# Patient Record
Sex: Male | Born: 1970
Health system: Southern US, Community
[De-identification: ages and names within clinical notes are randomized; demographics above are authoritative.]

## PROBLEM LIST (undated history)

## (undated) DIAGNOSIS — G5600 Carpal tunnel syndrome, unspecified upper limb: Secondary | ICD-10-CM

## (undated) DIAGNOSIS — E119 Type 2 diabetes mellitus without complications: Secondary | ICD-10-CM

---

## 2012-02-07 ENCOUNTER — Encounter (HOSPITAL_COMMUNITY): Payer: Self-pay | Admitting: *Deleted

## 2012-02-07 ENCOUNTER — Emergency Department (HOSPITAL_COMMUNITY)
Admission: EM | Admit: 2012-02-07 | Discharge: 2012-02-07 | Disposition: A | Discharge status: Home / Self Care | Payer: Self-pay | Attending: Emergency Medicine | Admitting: Emergency Medicine

## 2012-02-07 ENCOUNTER — Emergency Department (HOSPITAL_COMMUNITY): Payer: Self-pay

## 2012-02-07 DIAGNOSIS — Z862 Personal history of diseases of the blood and blood-forming organs and certain disorders involving the immune mechanism: Secondary | ICD-10-CM | POA: Insufficient documentation

## 2012-02-07 DIAGNOSIS — Z8639 Personal history of other endocrine, nutritional and metabolic disease: Secondary | ICD-10-CM | POA: Insufficient documentation

## 2012-02-07 DIAGNOSIS — R072 Precordial pain: Secondary | ICD-10-CM | POA: Insufficient documentation

## 2012-02-07 DIAGNOSIS — R079 Chest pain, unspecified: Secondary | ICD-10-CM

## 2012-02-07 LAB — CBC WITH DIFFERENTIAL/PLATELET
Basophils Absolute: 0.1 10*3/uL (ref 0.0–0.1)
HCT: 42.4 % (ref 39.0–52.0)
Hemoglobin: 14.7 g/dL (ref 13.0–17.0)
Lymphocytes Relative: 52 % — ABNORMAL HIGH (ref 12–46)
Lymphs Abs: 1.8 10*3/uL (ref 0.7–4.0)
Monocytes Absolute: 0.3 10*3/uL (ref 0.1–1.0)
Monocytes Relative: 8 % (ref 3–12)
Neutro Abs: 1.2 10*3/uL — ABNORMAL LOW (ref 1.7–7.7)
RBC: 4.89 MIL/uL (ref 4.22–5.81)
RDW: 12.9 % (ref 11.5–15.5)
WBC: 3.3 10*3/uL — ABNORMAL LOW (ref 4.0–10.5)

## 2012-02-07 LAB — BASIC METABOLIC PANEL
CO2: 27 mEq/L (ref 19–32)
Chloride: 103 mEq/L (ref 96–112)
Creatinine, Ser: 0.89 mg/dL (ref 0.50–1.35)
Glucose, Bld: 109 mg/dL — ABNORMAL HIGH (ref 70–99)

## 2012-02-07 LAB — TROPONIN I: Troponin I: <0.3 ng/mL (ref <0.30)

## 2012-02-07 NOTE — ED Provider Notes (Signed)
History     CSN: [id number]  Arrival date & time 02/07/12  0901   First MD Initiated Contact with Patient 02/07/12 [phone]      Chief Complaint  Patient presents with  . Chest Pain    (Consider location/radiation/quality/duration/timing/severity/associated sxs/prior treatment) HPI Pt complains of intermittent substernal chest pain x 2 days. Pt states the CP occurs at rest and is "pulling" and pressure-like in quality. 9/10 in severity. He states the only time previous to the last 2 days he has had chest pain was 2 weeks ago while walking. Complains of accompanying SOB. He states that all CP episodes come on suddenly and resolve after a variable amount of time--usually 11m-1h--without intervention.  Denies diaphoresis, nausea. Denies recent illness, fevers/chills. Denies any radiation of the pain. Denies any exacerbating or alleviating factors. States he did not take anything for the pain. Pt has not seen a physician since being released from prison approximately 5 years prior. States he has a history of high cholesterol, and possibly elevated blood sugar. Denies any history of high blood pressure. Pt takes no medications. Pt states his mother had a heart attack at 69 and believes his uncle had a heart attack in his 76s.  History reviewed. No pertinent past medical history.  History reviewed. No pertinent past surgical history.  No family history on file.  History  Substance Use Topics  . Smoking status: Never Smoker   . Smokeless tobacco: Not on file  . Alcohol Use: No      Review of Systems  All other systems reviewed and are negative.    Allergies  Other  Home Medications  No current outpatient prescriptions on file.  BP 117/73  Pulse 59  Temp 98.1 F (36.7 C) (Oral)  Resp 14  Ht 5\' 6"  (1.676 m)  Wt 198 lb (89.812 kg)  BMI 31.96 kg/m2  SpO2 98%  Physical Exam  Constitutional: He is oriented to person, place, and time. He appears well-developed and  well-nourished. No distress.  HENT:  Head: Normocephalic and atraumatic.  Eyes: Pupils are equal, round, and reactive to light. No scleral icterus.  Neck: Normal range of motion. Neck supple. No tracheal deviation present.  Cardiovascular: Normal rate and regular rhythm.   No murmur heard. Pulmonary/Chest: Effort normal. He has no wheezes. He has no rales.       TTP of sternal area (with reproduction of identical pain as CC)  Abdominal: Soft. Bowel sounds are normal. He exhibits no distension. There is no tenderness.  Musculoskeletal: Normal range of motion. He exhibits no edema.  Neurological: He is alert and oriented to person, place, and time. No cranial nerve deficit.  Skin: Skin is dry. No rash noted.  Psychiatric: He has a normal mood and affect. His behavior is normal.    ED Course  Procedures (including critical care time)   Date: 02/07/2012  Rate: 60  Rhythm: normal sinus rhythm  QRS Axis: normal  Intervals: normal  ST/T Wave abnormalities: nonspecific ST changes  Conduction Disutrbances:none  Narrative Interpretation:   Old EKG Reviewed: none available   Labs Reviewed  CBC WITH DIFFERENTIAL - Abnormal; Notable for the following:    WBC 3.3 (*)     Neutrophils Relative 36 (*)     Neutro Abs 1.2 (*)     Lymphocytes Relative 52 (*)     Basophils Relative 2 (*)     All other components within normal limits  BASIC METABOLIC PANEL - Abnormal; Notable for the following:  Glucose, Bld 109 (*)     All other components within normal limits  TROPONIN I   Dg Chest 2 View  02/07/2012  *RADIOLOGY REPORT*  Clinical Data: Mid chest pain, shortness of breath  CHEST - 2 VIEW  Comparison: None  Findings: Upper-normal size of cardiac silhouette. Mediastinal contours and pulmonary vascularity normal. Lungs clear. No pleural effusion or pneumothorax. Bones unremarkable.  IMPRESSION: No acute abnormalities.   Original Report Authenticated By: Ulyses Southward, M.D.      1. Chest pain        MDM  EKG wnl and troponin negative. Pain likely MSK given recent increase in activity (playing basketball) and TTP of chest wall with reproducibility. Patient appropriate for discharge at this time.         Elfredia Nevins, MD 02/07/12 [phone]

## 2012-02-07 NOTE — ED Notes (Signed)
Pt c/o CP describes as a "pulling"  Sensation.  First felt the pain 2 days ago lasted about half the day, pain came back at 3 am today.  Denies any respiratory distress or other difficulties.

## 2012-02-08 NOTE — ED Provider Notes (Signed)
I saw and evaluated the patient, reviewed the resident's note and I agree with the findings and plan.  Chest pain. Low cardiac risk. EKG and enzymes are reassuring. Discharge home.  Date: 02/07/2012  Rate: 60  Rhythm: normal sinus rhythm  QRS Axis: normal  Intervals: normal  ST/T Wave abnormalities: normal  Conduction Disutrbances: none  Narrative Interpretation: unremarkable     Harrold Donath R. Rubin Payor, MD 02/08/12 1046

## 2013-03-23 ENCOUNTER — Encounter (HOSPITAL_COMMUNITY): Payer: Self-pay | Admitting: Emergency Medicine

## 2013-03-23 ENCOUNTER — Emergency Department (HOSPITAL_COMMUNITY)
Admission: EM | Admit: 2013-03-23 | Discharge: 2013-03-23 | Disposition: A | Discharge status: Home / Self Care | Payer: No Typology Code available for payment source | Attending: Emergency Medicine | Admitting: Emergency Medicine

## 2013-03-23 ENCOUNTER — Emergency Department (HOSPITAL_COMMUNITY): Payer: No Typology Code available for payment source

## 2013-03-23 DIAGNOSIS — S39012A Strain of muscle, fascia and tendon of lower back, initial encounter: Secondary | ICD-10-CM

## 2013-03-23 DIAGNOSIS — Y92410 Unspecified street and highway as the place of occurrence of the external cause: Secondary | ICD-10-CM | POA: Insufficient documentation

## 2013-03-23 DIAGNOSIS — S3981XA Other specified injuries of abdomen, initial encounter: Secondary | ICD-10-CM | POA: Insufficient documentation

## 2013-03-23 DIAGNOSIS — S161XXA Strain of muscle, fascia and tendon at neck level, initial encounter: Secondary | ICD-10-CM

## 2013-03-23 DIAGNOSIS — Y9389 Activity, other specified: Secondary | ICD-10-CM | POA: Insufficient documentation

## 2013-03-23 DIAGNOSIS — S335XXA Sprain of ligaments of lumbar spine, initial encounter: Secondary | ICD-10-CM | POA: Insufficient documentation

## 2013-03-23 DIAGNOSIS — S139XXA Sprain of joints and ligaments of unspecified parts of neck, initial encounter: Secondary | ICD-10-CM | POA: Insufficient documentation

## 2013-03-23 MED ORDER — CYCLOBENZAPRINE HCL 10 MG PO TABS: 10.0000 mg | ORAL_TABLET | Freq: Two times a day (BID) | ORAL | Status: DC | PRN

## 2013-03-23 MED ORDER — HYDROCODONE-ACETAMINOPHEN 5-325 MG PO TABS: 1.0000 | ORAL_TABLET | ORAL | Status: DC | PRN

## 2013-03-23 MED ORDER — HYDROCODONE-ACETAMINOPHEN 5-325 MG PO TABS
1.0000 | ORAL_TABLET | Freq: Once | ORAL | Status: AC
  Administered 2013-03-23: 1 via ORAL
  Filled 2013-03-23: qty 1

## 2013-03-23 NOTE — Progress Notes (Signed)
[facility] did not get to see patient but will be sending information on the [employer] application, using the address provided.  °
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
## 2013-03-23 NOTE — ED Provider Notes (Signed)
Medical screening examination/treatment/procedure(s) were performed by non-physician practitioner and as supervising physician I was immediately available for consultation/collaboration.  EKG Interpretation   None         William Anntionette Madkins, MD 03/23/13 1445 
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
## 2017-07-11 ENCOUNTER — Encounter (HOSPITAL_BASED_OUTPATIENT_CLINIC_OR_DEPARTMENT_OTHER): Payer: Self-pay | Admitting: *Deleted

## 2017-07-11 ENCOUNTER — Emergency Department (HOSPITAL_BASED_OUTPATIENT_CLINIC_OR_DEPARTMENT_OTHER)
Admission: EM | Admit: 2017-07-11 | Discharge: 2017-07-11 | Disposition: A | Discharge status: Home / Self Care | Payer: Self-pay | Attending: Emergency Medicine | Admitting: Emergency Medicine

## 2017-07-11 ENCOUNTER — Other Ambulatory Visit: Payer: Self-pay

## 2017-07-11 DIAGNOSIS — G5603 Carpal tunnel syndrome, bilateral upper limbs: Secondary | ICD-10-CM | POA: Insufficient documentation

## 2017-07-11 MED ORDER — HYDROCODONE-ACETAMINOPHEN 5-325 MG PO TABS: 1.0000 | ORAL_TABLET | Freq: Four times a day (QID) | ORAL | 0 refills | Status: DC | PRN

## 2017-07-11 MED ORDER — PREDNISONE 50 MG PO TABS: 50.0000 mg | ORAL_TABLET | Freq: Every day | ORAL | 0 refills | Status: DC

## 2017-07-11 MED ORDER — KETOROLAC TROMETHAMINE 60 MG/2ML IM SOLN
60.0000 mg | Freq: Once | INTRAMUSCULAR | Status: AC
  Administered 2017-07-11: 60 mg via INTRAMUSCULAR
  Filled 2017-07-11: qty 2

## 2017-07-11 NOTE — ED Triage Notes (Signed)
Numbness in both his hands x 2 weeks. States he has a hx of carpel tunnel.

## 2017-07-11 NOTE — Discharge Instructions (Addendum)
Return here as needed. Follow up with the hand surgeon provided.  °

## 2017-07-11 NOTE — ED Provider Notes (Signed)
[facility] EMERGENCY DEPARTMENT Provider Note   CSN: [id number] Arrival date & time: 07/11/17  1606     History   Chief Complaint No chief complaint on file.   HPI Tyrone Jennings is a 47 y.o. male.  HPI Patient presents to the emergency department with pain and tingling in his hands bilaterally.  The patient states he has had a history of this in the past and had injections into both wrist.  He states that that was about 5 years ago.  The patient states that he has not had many problems since then.  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  Patient states nothing seems to make the condition better or worse patient states he has had no fever, nausea, vomiting, weakness, dizziness or numbness.  States the pain seems to be worse at night. History reviewed. No pertinent past medical history.  There are no active problems to display for this patient.   History reviewed. No pertinent surgical history.      Home Medications    Prior to Admission medications   Medication Sig Start Date End Date Taking? Authorizing Provider  cyclobenzaprine (FLEXERIL) 10 MG tablet Take 1 tablet (10 mg total) by mouth 2 (two) times daily as needed for muscle spasms. 03/23/13   Cherrie Distance, PA-C  HYDROcodone-acetaminophen (NORCO/VICODIN) 5-325 MG per tablet Take 1 tablet by mouth every 4 (four) hours as needed for moderate pain. 03/23/13   Cherrie Distance, PA-C    Family History Family History  Problem Relation Age of Onset  . Diabetes Other     Social History Social History   Tobacco Use  . Smoking status: Never Smoker  . Smokeless tobacco: Never Used  Substance Use Topics  . Alcohol use: No  . Drug use: No     Allergies   Other   Review of Systems Review of Systems All other systems negative except as documented in the HPI. All pertinent positives and negatives as reviewed in the HPI.  Physical Exam Updated Vital Signs BP 133/77 (BP Location:  Right Arm)   Pulse 92   Temp 98.7 F (37.1 C) (Oral)   Resp 16   Ht 5\' 6"  (1.676 m)   Wt 93 kg (205 lb)   SpO2 98%   BMI 33.09 kg/m   Physical Exam  Constitutional: He is oriented to person, place, and time. He appears well-developed and well-nourished. No distress.  HENT:  Head: Normocephalic and atraumatic.  Eyes: Pupils are equal, round, and reactive to light.  Pulmonary/Chest: Effort normal.  Musculoskeletal:       Right wrist: He exhibits tenderness. He exhibits no bony tenderness, no swelling, no effusion, no crepitus and no deformity.       Left wrist: He exhibits tenderness. He exhibits no swelling, no effusion, no crepitus and no deformity.  Neurological: He is alert and oriented to person, place, and time.  Skin: Skin is warm and dry.  Psychiatric: He has a normal mood and affect.  Nursing note and vitals reviewed.    ED Treatments / Results  Labs (all labs ordered are listed, but only abnormal results are displayed) Labs Reviewed - No data to display  EKG None  Radiology No results found.  Procedures Procedures (including critical care time)  Medications Ordered in ED Medications  ketorolac (TORADOL) injection 60 mg (has no administration in time range)     Initial Impression / Assessment and Plan / ED Course  I have  reviewed the triage vital signs and the nursing notes.  Pertinent labs & imaging results that were available during my care of the patient were reviewed by me and considered in my medical decision making (see chart for details).    She will be referred to hand surgery told return here as needed advised the patient to use heat over the wrists as well as the splints at bedtime.  Patient agrees the plan and all questions were answered.  Final Clinical Impressions(s) / ED Diagnoses   Final diagnoses:  None    ED Discharge Orders    None       Kyra MangesLawyer, Tali Cleaves, PA-C 07/11/17 1749    Rolland PorterJames, Mark, MD 07/12/17 2022

## 2020-03-09 ENCOUNTER — Inpatient Hospital Stay (HOSPITAL_BASED_OUTPATIENT_CLINIC_OR_DEPARTMENT_OTHER)
Admission: EM | Admit: 2020-03-09 | Discharge: 2020-03-11 | DRG: 638 | Disposition: A | Discharge status: Home / Self Care | Payer: Self-pay | Attending: Internal Medicine | Admitting: Internal Medicine

## 2020-03-09 ENCOUNTER — Other Ambulatory Visit: Payer: Self-pay

## 2020-03-09 ENCOUNTER — Encounter (HOSPITAL_BASED_OUTPATIENT_CLINIC_OR_DEPARTMENT_OTHER): Payer: Self-pay

## 2020-03-09 DIAGNOSIS — Z833 Family history of diabetes mellitus: Secondary | ICD-10-CM

## 2020-03-09 DIAGNOSIS — E669 Obesity, unspecified: Secondary | ICD-10-CM | POA: Diagnosis present

## 2020-03-09 DIAGNOSIS — N179 Acute kidney failure, unspecified: Secondary | ICD-10-CM | POA: Diagnosis present

## 2020-03-09 DIAGNOSIS — Z6834 Body mass index (BMI) 34.0-34.9, adult: Secondary | ICD-10-CM

## 2020-03-09 DIAGNOSIS — E1100 Type 2 diabetes mellitus with hyperosmolarity without nonketotic hyperglycemic-hyperosmolar coma (NKHHC): Secondary | ICD-10-CM | POA: Diagnosis present

## 2020-03-09 DIAGNOSIS — E1165 Type 2 diabetes mellitus with hyperglycemia: Principal | ICD-10-CM | POA: Diagnosis present

## 2020-03-09 DIAGNOSIS — R7989 Other specified abnormal findings of blood chemistry: Secondary | ICD-10-CM

## 2020-03-09 DIAGNOSIS — E876 Hypokalemia: Secondary | ICD-10-CM | POA: Diagnosis not present

## 2020-03-09 DIAGNOSIS — R739 Hyperglycemia, unspecified: Secondary | ICD-10-CM

## 2020-03-09 DIAGNOSIS — Z20822 Contact with and (suspected) exposure to covid-19: Secondary | ICD-10-CM | POA: Diagnosis present

## 2020-03-09 HISTORY — DX: Carpal tunnel syndrome, unspecified upper limb: G56.00

## 2020-03-09 LAB — BASIC METABOLIC PANEL
Anion gap: 17 — ABNORMAL HIGH (ref 5–15)
Anion gap: 14 (ref 5–15)
Anion gap: 10 (ref 5–15)
BUN: 18 mg/dL (ref 6–20)
BUN: 13 mg/dL (ref 6–20)
BUN: 11 mg/dL (ref 6–20)
CO2: 24 mmol/L (ref 22–32)
CO2: 27 mmol/L (ref 22–32)
CO2: 29 mmol/L (ref 22–32)
Calcium: 10.8 mg/dL — ABNORMAL HIGH (ref 8.9–10.3)
Calcium: 10.6 mg/dL — ABNORMAL HIGH (ref 8.9–10.3)
Calcium: 10.5 mg/dL — ABNORMAL HIGH (ref 8.9–10.3)
Chloride: 82 mmol/L — ABNORMAL LOW (ref 98–111)
Chloride: 100 mmol/L (ref 98–111)
Chloride: 104 mmol/L (ref 98–111)
Creatinine, Ser: 1.62 mg/dL — ABNORMAL HIGH (ref 0.61–1.24)
Creatinine, Ser: 1.08 mg/dL (ref 0.61–1.24)
Creatinine, Ser: 1.12 mg/dL (ref 0.61–1.24)
GFR, Estimated: 51 mL/min — ABNORMAL LOW (ref >60)
GFR, Estimated: >60 mL/min (ref >60)
GFR, Estimated: >60 mL/min (ref >60)
Glucose, Bld: 1194 mg/dL (ref 70–99)
Glucose, Bld: 286 mg/dL — ABNORMAL HIGH (ref 70–99)
Glucose, Bld: 222 mg/dL — ABNORMAL HIGH (ref 70–99)
Potassium: 4.9 mmol/L (ref 3.5–5.1)
Potassium: 3.8 mmol/L (ref 3.5–5.1)
Potassium: 3.7 mmol/L (ref 3.5–5.1)
Sodium: 123 mmol/L — ABNORMAL LOW (ref 135–145)
Sodium: 141 mmol/L (ref 135–145)
Sodium: 143 mmol/L (ref 135–145)

## 2020-03-09 LAB — I-STAT VENOUS BLOOD GAS, ED
Acid-Base Excess: 3 mmol/L — ABNORMAL HIGH (ref 0.0–2.0)
Bicarbonate: 29.8 mmol/L — ABNORMAL HIGH (ref 20.0–28.0)
Calcium, Ion: 1.29 mmol/L (ref 1.15–1.40)
HCT: 49 % (ref 39.0–52.0)
Hemoglobin: 16.7 g/dL (ref 13.0–17.0)
O2 Saturation: 92 %
Potassium: 5 mmol/L (ref 3.5–5.1)
Sodium: 125 mmol/L — ABNORMAL LOW (ref 135–145)
TCO2: 31 mmol/L (ref 22–32)
pCO2, Ven: 50.5 mmHg (ref 44.0–60.0)
pH, Ven: 7.378 (ref 7.250–7.430)
pO2, Ven: 66 mmHg — ABNORMAL HIGH (ref 32.0–45.0)

## 2020-03-09 LAB — HEPATIC FUNCTION PANEL
ALT: 33 U/L (ref 0–44)
AST: 23 U/L (ref 15–41)
Albumin: 5 g/dL (ref 3.5–5.0)
Alkaline Phosphatase: 133 U/L — ABNORMAL HIGH (ref 38–126)
Bilirubin, Direct: 0.3 mg/dL — ABNORMAL HIGH (ref 0.0–0.2)
Indirect Bilirubin: 1 mg/dL — ABNORMAL HIGH (ref 0.3–0.9)
Total Bilirubin: 1.3 mg/dL — ABNORMAL HIGH (ref 0.3–1.2)
Total Protein: 9.7 g/dL — ABNORMAL HIGH (ref 6.5–8.1)

## 2020-03-09 LAB — URINALYSIS, MICROSCOPIC (REFLEX)

## 2020-03-09 LAB — CBC WITH DIFFERENTIAL/PLATELET
Abs Immature Granulocytes: 0.01 10*3/uL (ref 0.00–0.07)
Basophils Absolute: 0 10*3/uL (ref 0.0–0.1)
Basophils Relative: 1 %
Eosinophils Absolute: 0 10*3/uL (ref 0.0–0.5)
Eosinophils Relative: 1 %
HCT: 45.5 % (ref 39.0–52.0)
Hemoglobin: 15.8 g/dL (ref 13.0–17.0)
Immature Granulocytes: 0 %
Lymphocytes Relative: 28 %
Lymphs Abs: 1.6 10*3/uL (ref 0.7–4.0)
MCH: 30 pg (ref 26.0–34.0)
MCHC: 34.7 g/dL (ref 30.0–36.0)
MCV: 86.3 fL (ref 80.0–100.0)
Monocytes Absolute: 0.4 10*3/uL (ref 0.1–1.0)
Monocytes Relative: 7 %
Neutro Abs: 3.6 10*3/uL (ref 1.7–7.7)
Neutrophils Relative %: 63 %
Platelets: 275 10*3/uL (ref 150–400)
RBC: 5.27 MIL/uL (ref 4.22–5.81)
RDW: 11.9 % (ref 11.5–15.5)
WBC: 5.7 10*3/uL (ref 4.0–10.5)
nRBC: 0 % (ref 0.0–0.2)

## 2020-03-09 LAB — RESP PANEL BY RT-PCR (FLU A&B, COVID) ARPGX2
Influenza A by PCR: NEGATIVE
Influenza B by PCR: NEGATIVE
SARS Coronavirus 2 by RT PCR: NEGATIVE

## 2020-03-09 LAB — CBG MONITORING, ED
Glucose-Capillary: >600 mg/dL (ref 70–99)
Glucose-Capillary: >600 mg/dL (ref 70–99)
Glucose-Capillary: >600 mg/dL (ref 70–99)
Glucose-Capillary: >600 mg/dL (ref 70–99)
Glucose-Capillary: >600 mg/dL (ref 70–99)
Glucose-Capillary: 484 mg/dL — ABNORMAL HIGH (ref 70–99)
Glucose-Capillary: 376 mg/dL — ABNORMAL HIGH (ref 70–99)
Glucose-Capillary: 297 mg/dL — ABNORMAL HIGH (ref 70–99)
Glucose-Capillary: 266 mg/dL — ABNORMAL HIGH (ref 70–99)

## 2020-03-09 LAB — URINALYSIS, ROUTINE W REFLEX MICROSCOPIC
Bilirubin Urine: NEGATIVE
Glucose, UA: >=500 mg/dL — AB
Ketones, ur: NEGATIVE mg/dL
Leukocytes,Ua: NEGATIVE
Nitrite: NEGATIVE
Protein, ur: NEGATIVE mg/dL
Specific Gravity, Urine: <1.005 — ABNORMAL LOW (ref 1.005–1.030)
pH: 5 (ref 5.0–8.0)

## 2020-03-09 LAB — HEMOGLOBIN A1C
Hgb A1c MFr Bld: 10.5 % — ABNORMAL HIGH (ref 4.8–5.6)
Mean Plasma Glucose: 254.65 mg/dL

## 2020-03-09 LAB — GLUCOSE, CAPILLARY
Glucose-Capillary: 145 mg/dL — ABNORMAL HIGH (ref 70–99)
Glucose-Capillary: 163 mg/dL — ABNORMAL HIGH (ref 70–99)
Glucose-Capillary: 165 mg/dL — ABNORMAL HIGH (ref 70–99)
Glucose-Capillary: 170 mg/dL — ABNORMAL HIGH (ref 70–99)
Glucose-Capillary: 192 mg/dL — ABNORMAL HIGH (ref 70–99)
Glucose-Capillary: 207 mg/dL — ABNORMAL HIGH (ref 70–99)
Glucose-Capillary: 211 mg/dL — ABNORMAL HIGH (ref 70–99)
Glucose-Capillary: 226 mg/dL — ABNORMAL HIGH (ref 70–99)

## 2020-03-09 LAB — MRSA PCR SCREENING: MRSA by PCR: NEGATIVE

## 2020-03-09 LAB — LIPASE, BLOOD: Lipase: 32 U/L (ref 11–51)

## 2020-03-09 LAB — BETA-HYDROXYBUTYRIC ACID: Beta-Hydroxybutyric Acid: 0.57 mmol/L — ABNORMAL HIGH (ref 0.05–0.27)

## 2020-03-09 MED ORDER — SODIUM CHLORIDE 0.9 % IV BOLUS
1000.0000 mL | INTRAVENOUS | Status: AC
  Administered 2020-03-09 (×2): 1000 mL via INTRAVENOUS

## 2020-03-09 MED ORDER — DEXTROSE 50 % IV SOLN: 0.0000 mL | INTRAVENOUS | Status: DC | PRN

## 2020-03-09 MED ORDER — ENOXAPARIN SODIUM 40 MG/0.4ML ~~LOC~~ SOLN
40.0000 mg | SUBCUTANEOUS | Status: DC
  Administered 2020-03-09 – 2020-03-10 (×2): 40 mg via SUBCUTANEOUS
  Filled 2020-03-09 (×2): qty 0.4

## 2020-03-09 MED ORDER — INSULIN REGULAR(HUMAN) IN NACL 100-0.9 UT/100ML-% IV SOLN
INTRAVENOUS | Status: AC
  Administered 2020-03-09: 10:00:00 6 [IU]/h via INTRAVENOUS
  Filled 2020-03-09: qty 100

## 2020-03-09 MED ORDER — PNEUMOCOCCAL VAC POLYVALENT 25 MCG/0.5ML IJ INJ
0.5000 mL | INJECTION | INTRAMUSCULAR | Status: DC
  Filled 2020-03-09: qty 0.5

## 2020-03-09 MED ORDER — INSULIN REGULAR(HUMAN) IN NACL 100-0.9 UT/100ML-% IV SOLN
INTRAVENOUS | Status: DC
  Administered 2020-03-09: 18:00:00 1.3 [IU]/h via INTRAVENOUS

## 2020-03-09 MED ORDER — DEXTROSE IN LACTATED RINGERS 5 % IV SOLN: INTRAVENOUS | Status: DC

## 2020-03-09 MED ORDER — CHLORHEXIDINE GLUCONATE CLOTH 2 % EX PADS
6.0000 | MEDICATED_PAD | Freq: Every day | CUTANEOUS | Status: DC
  Administered 2020-03-09 – 2020-03-10 (×2): 6 via TOPICAL

## 2020-03-09 MED ORDER — SODIUM CHLORIDE 0.9 % IV BOLUS: 1000.0000 mL | Freq: Once | INTRAVENOUS | Status: DC

## 2020-03-09 MED ORDER — LACTATED RINGERS IV SOLN: INTRAVENOUS | Status: DC

## 2020-03-09 NOTE — ED Triage Notes (Signed)
He c/o polyuria and polydipsia x 2 days. He denies fever/dysuria,nor any other sign of current illness. He is ambulatory and in no distress.

## 2020-03-09 NOTE — ED Notes (Signed)
Report to [facility] 

## 2020-03-09 NOTE — ED Notes (Signed)
As I write this Dr. Lockie Mola is speaking with pt. And his significant other per their request.

## 2020-03-09 NOTE — ED Notes (Signed)
Thus far, the series of "HI" readings notwithstanding, the Endotool has instructed me not to change the insulin infusion from 6 units per hour, at which it was initially started.

## 2020-03-09 NOTE — ED Notes (Signed)
Insulin drip remains the same at 4.2
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
## 2020-03-09 NOTE — H&P (Signed)
[facility] History and Physical  [name] IEP:[id number] DOB: [birth year] DOA: 03/09/2020  Referring physician: ED  PCP: Patient, No Pcp Per   Patient is coming from: Home  Chief Complaint: Polyuria, polydipsia  HPI: Tyrone Jennings is a 49 y.o. male with no documented past medical history except for history of carpal tunnel syndrome in the past currently not on any medication presented to the hospital with complaints of frequent urination and excessive thirst for the last 2 days.  He also complained of vomiting yesterday without much abdominal pain.  Patient denied any urinary urgency, burning but had polyuria.  Denied any fever, chills, shortness of breath, cough or chest pain.  Denied any dizziness, lightheadedness headache or syncope.  Patient denied any skin rash, swelling of joints.  Patient denies any changes in his dietary regimen except that he drinks a lot of Gatorade.  Denies history of diabetes or any other medical issues in the past.    Due to the symptoms, patient presented to [facility] med center where he was noted to have elevated blood glucose level of 1194.  Urinalysis showed more than 500 glucose. Patient was diagnosed of having hyperosmolar state and was started on insulin and IV fluids.  Patient was then considered for admission admission to stepdown unit for insulin drip.  Review of Systems:  All systems were reviewed and were negative unless otherwise mentioned in the HPI  Past Medical History:  Diagnosis Date  . Carpal tunnel syndrome    History reviewed. No pertinent surgical history.  Social History:  reports that he has never smoked. He has never used smokeless tobacco. He reports that he does not drink alcohol and does not use drugs.  Allergies  Allergen Reactions  . Other Other (See Comments)    Clorox. "flares me up."    Family History  Problem Relation Age of Onset  . Diabetes  mother      Prior to Admission medications   Medication Sig  Start Date End Date Taking? Authorizing Provider  cyclobenzaprine (FLEXERIL) 10 MG tablet Take 1 tablet (10 mg total) by mouth 2 (two) times daily as needed for muscle spasms. 03/23/13   Cherrie Distance, PA-C  HYDROcodone-acetaminophen (NORCO/VICODIN) 5-325 MG tablet Take 1 tablet by mouth every 6 (six) hours as needed for moderate pain. 07/11/17   Lawyer, Cristal Deer, PA-C  predniSONE (DELTASONE) 50 MG tablet Take 1 tablet (50 mg total) by mouth daily with breakfast. 07/11/17   Charlestine Night, PA-C    Physical Exam: Vitals:   03/09/20 1430 03/09/20 1500 03/09/20 1600 03/09/20 1700  BP:  133/83 132/83   Pulse: (!) 102 92 89   Resp: 20 18 18    Temp:   98.5 F (36.9 C) 98.1 F (36.7 C)  TempSrc:   Oral Oral  SpO2: 97% 97% 97%   Weight:      Height:       Wt Readings from Last 3 Encounters:  03/09/20 97.5 kg  07/11/17 93 kg  02/07/12 89.8 kg   Body mass index is 34.7 kg/m.  General: Obese built, not in obvious distress HENT: Normocephalic, pupils equally reacting to light and accommodation.  No scleral pallor or icterus noted. Oral mucosa is dry Chest:  Clear breath sounds.  Diminished breath sounds bilaterally. No crackles or wheezes.  CVS: S1 &S2 heard. No murmur.  Regular rate and rhythm. Abdomen: Soft, nontender, nondistended.  Bowel sounds are heard.  Liver is not palpable, no abdominal mass palpated Extremities: No cyanosis, clubbing  or edema.  Peripheral pulses are palpable. Psych: Alert, awake and oriented, normal mood CNS:  No cranial nerve deficits.  Power equal in all extremities.   No cerebellar signs.   Skin: Warm and dry.  No rashes noted.  Labs on Admission:   CBC: Recent Labs  Lab 03/09/20 0800 03/09/20 0822  WBC 5.7  --   NEUTROABS 3.6  --   HGB 15.8 16.7  HCT 45.5 49.0  MCV 86.3  --   PLT 275  --     Basic Metabolic Panel: Recent Labs  Lab 03/09/20 0800 03/09/20 0822 03/09/20 1532  NA 123* 125* 141  K 4.9 5.0 3.8  CL 82*  --  100  CO2  24  --  27  GLUCOSE 1,194*  --  286*  BUN 18  --  13  CREATININE 1.62*  --  1.08  CALCIUM 10.8*  --  10.6*    Liver Function Tests: Recent Labs  Lab 03/09/20 0800  AST 23  ALT 33  ALKPHOS 133*  BILITOT 1.3*  PROT 9.7*  ALBUMIN 5.0   Recent Labs  Lab 03/09/20 0800  LIPASE 32   No results for input(s): AMMONIA in the last 168 hours.  Cardiac Enzymes: No results for input(s): CKTOTAL, CKMB, CKMBINDEX, TROPONINI in the last 168 hours.  BNP (last 3 results) No results for input(s): BNP in the last 8760 hours.  ProBNP (last 3 results) No results for input(s): PROBNP in the last 8760 hours.  CBG: Recent Labs  Lab 03/09/20 1235 03/09/20 1341 03/09/20 1442 03/09/20 1541 03/09/20 1654  GLUCAP 484* 376* 297* 266* 207*    Lipase     Component Value Date/Time   LIPASE 32 03/09/2020 0800     Urinalysis    Component Value Date/Time   COLORURINE YELLOW 03/09/2020 0734   APPEARANCEUR CLEAR 03/09/2020 0734   LABSPEC <1.005 (L) 03/09/2020 0734   PHURINE 5.0 03/09/2020 0734   GLUCOSEU >=500 (A) 03/09/2020 0734   HGBUR TRACE (A) 03/09/2020 0734   BILIRUBINUR NEGATIVE 03/09/2020 0734   KETONESUR NEGATIVE 03/09/2020 0734   PROTEINUR NEGATIVE 03/09/2020 0734   NITRITE NEGATIVE 03/09/2020 0734   LEUKOCYTESUR NEGATIVE 03/09/2020 0734     Drugs of Abuse  No results found for: LABOPIA, COCAINSCRNUR, LABBENZ, AMPHETMU, THCU, LABBARB    Radiological Exams on Admission: No results found.  EKG: Not available for review  Assessment/Plan Principal Problem:   Hyperosmolar hyperglycemic state (HHS) (HCC)  Hyperosmolar hyperglycemic state.  Blood glucose level more than 1100 on presentation.  Likely new diagnosis of diabetes.  Patient is obese.  Family history of diabetes.  Will check A1c.   Continue insulin drip and IV fluids.  Change to long-acting insulin sliding scale insulin with the next BMP.Mild gap but bicarb was normal.  We will continue insulin given the fact that  patient had extremely elevated blood glucose levels.  Pseudohyponatremia.  Improved with hydration and glucose control.  Mild acute kidney injury.  Improved with IV fluids.  Check levels in a.m.  History of carpal tunnel syndrome in the past.  Not active at this time.  DVT Prophylaxis: Lovenox subcu  Consultant: None  Code Status: Full code  Microbiology none  Antibiotics: None  Family Communication:  Patients' condition and plan of care including tests being ordered have been discussed with the patient who indicate understanding and agree with the plan.   Status is: Inpatient  Remains inpatient appropriate because:Persistent severe electrolyte disturbances, IV treatments appropriate due to intensity  of illness or inability to take PO, Inpatient level of care appropriate due to severity of illness and IV insulin, likely new diabetes.   Dispo: The patient is from: Home              Anticipated d/c is to: Home              Anticipated d/c date is: 1 day              Patient currently is not medically stable to d/c.   Severity of Illness: The appropriate patient status for this patient is INPATIENT. Inpatient status is judged to be reasonable and necessary in order to provide the required intensity of service to ensure the patient's safety. The patient's presenting symptoms, physical exam findings, and initial radiographic and laboratory data in the context of their chronic comorbidities is felt to place them at high risk for further clinical deterioration. Furthermore, it is not anticipated that the patient will be medically stable for discharge from the hospital within 2 midnights of admission.  I certify that at the point of admission it is my clinical judgment that the patient will require inpatient hospital care spanning beyond 2 midnights from the point of admission due to high intensity of service, high risk for further deterioration and high frequency of surveillance  required.   Signed, Joycelyn Das, MD [facility] 03/09/2020

## 2020-03-10 DIAGNOSIS — E876 Hypokalemia: Secondary | ICD-10-CM

## 2020-03-10 DIAGNOSIS — E119 Type 2 diabetes mellitus without complications: Secondary | ICD-10-CM

## 2020-03-10 LAB — BASIC METABOLIC PANEL
Anion gap: 9 (ref 5–15)
BUN: 10 mg/dL (ref 6–20)
CO2: 27 mmol/L (ref 22–32)
Calcium: 9.8 mg/dL (ref 8.9–10.3)
Chloride: 105 mmol/L (ref 98–111)
Creatinine, Ser: 1.1 mg/dL (ref 0.61–1.24)
GFR, Estimated: >60 mL/min (ref >60)
Glucose, Bld: 170 mg/dL — ABNORMAL HIGH (ref 70–99)
Potassium: 3.4 mmol/L — ABNORMAL LOW (ref 3.5–5.1)
Sodium: 141 mmol/L (ref 135–145)

## 2020-03-10 LAB — CBC
HCT: 42.7 % (ref 39.0–52.0)
HCT: 43.2 % (ref 39.0–52.0)
Hemoglobin: 14.8 g/dL (ref 13.0–17.0)
Hemoglobin: 15 g/dL (ref 13.0–17.0)
MCH: 29.5 pg (ref 26.0–34.0)
MCH: 29.8 pg (ref 26.0–34.0)
MCHC: 34.7 g/dL (ref 30.0–36.0)
MCHC: 34.7 g/dL (ref 30.0–36.0)
MCV: 84.9 fL (ref 80.0–100.0)
MCV: 85.9 fL (ref 80.0–100.0)
Platelets: 237 10*3/uL (ref 150–400)
Platelets: 250 10*3/uL (ref 150–400)
RBC: 4.97 MIL/uL (ref 4.22–5.81)
RBC: 5.09 MIL/uL (ref 4.22–5.81)
RDW: 11.9 % (ref 11.5–15.5)
RDW: 11.9 % (ref 11.5–15.5)
WBC: 6.9 10*3/uL (ref 4.0–10.5)
WBC: 7.4 10*3/uL (ref 4.0–10.5)
nRBC: 0 % (ref 0.0–0.2)
nRBC: 0 % (ref 0.0–0.2)

## 2020-03-10 LAB — GLUCOSE, CAPILLARY
Glucose-Capillary: 159 mg/dL — ABNORMAL HIGH (ref 70–99)
Glucose-Capillary: 163 mg/dL — ABNORMAL HIGH (ref 70–99)
Glucose-Capillary: 149 mg/dL — ABNORMAL HIGH (ref 70–99)
Glucose-Capillary: 151 mg/dL — ABNORMAL HIGH (ref 70–99)
Glucose-Capillary: 154 mg/dL — ABNORMAL HIGH (ref 70–99)
Glucose-Capillary: 155 mg/dL — ABNORMAL HIGH (ref 70–99)
Glucose-Capillary: 141 mg/dL — ABNORMAL HIGH (ref 70–99)
Glucose-Capillary: 351 mg/dL — ABNORMAL HIGH (ref 70–99)
Glucose-Capillary: 289 mg/dL — ABNORMAL HIGH (ref 70–99)
Glucose-Capillary: 191 mg/dL — ABNORMAL HIGH (ref 70–99)

## 2020-03-10 LAB — MAGNESIUM: Magnesium: 2.2 mg/dL (ref 1.7–2.4)

## 2020-03-10 LAB — PHOSPHORUS: Phosphorus: 2.9 mg/dL (ref 2.5–4.6)

## 2020-03-10 MED ORDER — INSULIN ASPART PROT & ASPART (70-30 MIX) 100 UNIT/ML ~~LOC~~ SUSP
12.0000 [IU] | Freq: Two times a day (BID) | SUBCUTANEOUS | Status: DC
  Administered 2020-03-10 – 2020-03-11 (×2): 12 [IU] via SUBCUTANEOUS
  Filled 2020-03-10: qty 10

## 2020-03-10 MED ORDER — INSULIN ASPART 100 UNIT/ML ~~LOC~~ SOLN
0.0000 [IU] | Freq: Three times a day (TID) | SUBCUTANEOUS | Status: DC
  Administered 2020-03-10: 08:00:00 2 [IU] via SUBCUTANEOUS
  Administered 2020-03-10: 12:00:00 9 [IU] via SUBCUTANEOUS

## 2020-03-10 MED ORDER — DEXTROSE IN LACTATED RINGERS 5 % IV SOLN: INTRAVENOUS | Status: DC

## 2020-03-10 MED ORDER — INSULIN STARTER KIT- PEN NEEDLES (ENGLISH)
1.0000 | Freq: Once | Status: AC
  Administered 2020-03-10: 12:00:00 1
  Filled 2020-03-10: qty 1

## 2020-03-10 MED ORDER — LIVING WELL WITH DIABETES BOOK
Freq: Once | Status: AC
  Filled 2020-03-10: qty 1

## 2020-03-10 MED ORDER — INSULIN ASPART 100 UNIT/ML ~~LOC~~ SOLN: 0.0000 [IU] | Freq: Every day | SUBCUTANEOUS | Status: DC

## 2020-03-10 MED ORDER — ACETAMINOPHEN 325 MG PO TABS
650.0000 mg | ORAL_TABLET | Freq: Four times a day (QID) | ORAL | Status: DC | PRN
  Administered 2020-03-10: 650 mg via ORAL
  Filled 2020-03-10: qty 2

## 2020-03-10 MED ORDER — COVID-19 MRNA VACCINE (PFIZER) 30 MCG/0.3ML IM SUSP
0.3000 mL | Freq: Once | INTRAMUSCULAR | Status: DC
  Filled 2020-03-10: qty 0.3

## 2020-03-10 MED ORDER — INSULIN ASPART 100 UNIT/ML ~~LOC~~ SOLN
0.0000 [IU] | Freq: Three times a day (TID) | SUBCUTANEOUS | Status: DC
  Administered 2020-03-10 – 2020-03-11 (×2): 11 [IU] via SUBCUTANEOUS
  Administered 2020-03-11: 12:00:00 4 [IU] via SUBCUTANEOUS

## 2020-03-10 MED ORDER — INSULIN ASPART 100 UNIT/ML ~~LOC~~ SOLN: 4.0000 [IU] | Freq: Three times a day (TID) | SUBCUTANEOUS | Status: DC

## 2020-03-10 MED ORDER — INSULIN GLARGINE 100 UNIT/ML ~~LOC~~ SOLN
10.0000 [IU] | Freq: Every day | SUBCUTANEOUS | Status: DC
  Administered 2020-03-10: 08:00:00 10 [IU] via SUBCUTANEOUS
  Filled 2020-03-10: qty 0.1

## 2020-03-10 NOTE — TOC Initial Note (Signed)
Transition of Care [facility]) - Initial/Assessment Note    Patient Details  Name: Tyrone Jennings MRN: [id number] Date of Birth: 23-Nov-1970  Transition of Care [facility]) CM/SW Contact:    Golda Acre, RN Phone Number: 03/10/2020, 1:16 PM  Clinical Narrative:                 Appointment with the [facility] set for January 17 at 0930.  This was to the patient and placed in the discharge instruction. Did explain to shop around for the cheapest sygringes and needles,suggested that he buy a bag of cotton balls and alcohol at the dollar stores instead of buy the prepackaged wipes.  Does have a glucometer and has strips.  Expected Discharge Plan: (P) Home/Self Care Barriers to Discharge: (P) No Barriers Identified   Patient Goals and CMS Choice Patient states their goals for this hospitalization and ongoing recovery are:: (P) to go home CMS Medicare.gov Compare Post Acute Care list provided to:: (P) Patient Choice offered to / list presented to : (P) Patient  Expected Discharge Plan and Services Expected Discharge Plan: (P) Home/Self Care   Discharge Planning Services: (P) CM Consult,Follow-up appt scheduled,[facility]   Living arrangements for the past 2 months: (P) Single Family Home                                      Prior Living Arrangements/Services Living arrangements for the past 2 months: (P) Single Family Home Lives with:: (P) Self                   Activities of Daily Living Home Assistive Devices/Equipment: None ADL Screening (condition at time of admission) Patient's cognitive ability adequate to safely complete daily activities?: Yes Is the patient deaf or have difficulty hearing?: No Does the patient have difficulty seeing, even when wearing glasses/contacts?: No Does the patient have difficulty concentrating, remembering, or making decisions?: No Patient able to express need for assistance with ADLs?: No Does the  patient have difficulty dressing or bathing?: No Independently performs ADLs?: Yes (appropriate for developmental age) Does the patient have difficulty walking or climbing stairs?: No Weakness of Legs: None Weakness of Arms/Hands: None  Permission Sought/Granted                  Emotional Assessment              Admission diagnosis:  Hyperglycemia [R73.9] Hyperosmolar hyperglycemic state (HHS) (HCC) [E11.00, E11.65] Patient Active Problem List   Diagnosis Date Noted  . Hyperosmolar hyperglycemic state (HHS) (HCC) 03/09/2020   PCP:  Patient, No Pcp Per Pharmacy:   [id number] - [place], [place] - [address] AT [facility] [address] [place] [address] Phone: [phone] Fax: [phone]     Social Determinants of Health (SDOH) Interventions    Readmission Risk Interventions No flowsheet data found.

## 2020-03-10 NOTE — Progress Notes (Signed)
Chaplain checked-in with Tyrone Jennings and will follow-up to offer support.     03/10/20 1200  Clinical Encounter Type  Visited With Patient  Visit Type Initial

## 2020-03-10 NOTE — Progress Notes (Signed)
PROGRESS NOTE  Tyrone Jennings KPT:[id number] DOB: 1971/03/19 DOA: 03/09/2020 PCP: Patient, No Pcp Per   LOS: 1 day   Brief narrative: Tyrone Jennings is a 49 y.o. male with no documented past medical history except for history of carpal tunnel syndrome in the past currently not on any medication presented to the hospital with complaints of frequent urination and excessive thirst for the  2 days prior to presentation. Due to the symptoms, patient presented to [facility] med center where he was noted to have elevated blood glucose level of 1194.  Urinalysis showed more than 500 glucose. Patient was diagnosed of having hyperosmolar state and was started on insulin and IV fluids.  Patient was then considered for admission admission to stepdown unit for insulin drip.   Assessment/Plan:  Principal Problem:   Hyperosmolar hyperglycemic state (HHS) (HCC)  Hyperosmolar hyperglycemic state.  Blood glucose level more than 1100 on presentation.    New diagnosis of diabetes.  Hemoglobin A1c of 10.5.  Diabetic coordinator has been consulted for insulin teaching.  Patient does not have insulin and will need medication assistance.  Have communicated with case management about.  Spoken with the nurse about diabetic education.   Has been started on long-acting insulin and resistant scale plus mealtime insulin since his sugars are still running high.Latest POC glucose of 351.  We will plan for discharge on NovoLog 70/30 on discharge.  Mild hypokalemia.  Will replace orally.  Magnesium was within normal limits.  Pseudohyponatremia.  Improved with hydration and glucose control.  Sodium level today at 141.  Mild acute kidney injury.  Improved with IV fluids.    Creatinine of 1.1 today.  Encourage oral hydration. Lab Results  Component Value Date   CREATININE 1.10 03/10/2020   CREATININE 1.12 03/09/2020   CREATININE 1.08 03/09/2020   Grade 1 obesity.  Patient was counseled about lifestyle modification increasing  activity and weight loss to improve diabetes.  History of carpal tunnel syndrome in the past.  Not active at this time.   DVT prophylaxis: enoxaparin (LOVENOX) injection 40 mg Start: 03/09/20 2200  Code Status: Full code  Family Communication: None  Status is: Inpatient  Remains inpatient appropriate because:IV treatments appropriate due to intensity of illness or inability to take PO, Inpatient level of care appropriate due to severity of illness and New diabetes requiring insulin, would need medication assistance, insulin teaching and education.   Dispo: The patient is from: Home              Anticipated d/c is to: Home              Anticipated d/c date is: 1 day, transfer the patient out of stepdown unit.              Patient currently is not medically stable to d/c.  Consultants:  None  Procedures:  None  Antibiotics:   None  Anti-infectives (From admission, onward)   None       Subjective: Today, patient was seen and examined at bedside.  Patient denies any nausea vomiting abdominal pain polyuria or polydipsia today.  Objective: Vitals:   03/10/20 0900 03/10/20 1200  BP: (!) 148/82   Pulse: 71   Resp: 14   Temp:  98.1 F (36.7 C)  SpO2: 99%     Intake/Output Summary (Last 24 hours) at 03/10/2020 1439 Last data filed at 03/10/2020 1255 Gross per 24 hour  Intake 4045.43 ml  Output 200 ml  Net 3845.43 ml  Filed Weights   03/09/20 0732  Weight: 97.5 kg   Body mass index is 34.7 kg/m.   Physical Exam: GENERAL: Patient is alert awake and oriented. Not in obvious distress.  Obese HENT: No scleral pallor or icterus. Pupils equally reactive to light. Oral mucosa is moist NECK: is supple, no gross swelling noted. CHEST: Clear to auscultation. No crackles or wheezes.  Diminished breath sounds bilaterally. CVS: S1 and S2 heard, no murmur. Regular rate and rhythm.  ABDOMEN: Soft, non-tender, bowel sounds are present. EXTREMITIES: No edema. CNS:  Cranial nerves are intact. No focal motor deficits. SKIN: warm and dry without rashes.  Data Review: I have personally reviewed the following laboratory data and studies,  CBC: Recent Labs  Lab 03/09/20 0800 03/09/20 0822 03/09/20 2336 03/10/20 0252  WBC 5.7  --  7.4 6.9  NEUTROABS 3.6  --   --   --   HGB 15.8 16.7 15.0 14.8  HCT 45.5 49.0 43.2 42.7  MCV 86.3  --  84.9 85.9  PLT 275  --  250 237   Basic Metabolic Panel: Recent Labs  Lab 03/09/20 0800 03/09/20 0822 03/09/20 1532 03/09/20 1931 03/10/20 0252  NA 123* 125* 141 143 141  K 4.9 5.0 3.8 3.7 3.4*  CL 82*  --  100 104 105  CO2 24  --  27 29 27   GLUCOSE 1,194*  --  286* 222* 170*  BUN 18  --  13 11 10   CREATININE 1.62*  --  1.08 1.12 1.10  CALCIUM 10.8*  --  10.6* 10.5* 9.8  MG  --   --   --   --  2.2  PHOS  --   --   --   --  2.9   Liver Function Tests: Recent Labs  Lab 03/09/20 0800  AST 23  ALT 33  ALKPHOS 133*  BILITOT 1.3*  PROT 9.7*  ALBUMIN 5.0   Recent Labs  Lab 03/09/20 0800  LIPASE 32   No results for input(s): AMMONIA in the last 168 hours. Cardiac Enzymes: No results for input(s): CKTOTAL, CKMB, CKMBINDEX, TROPONINI in the last 168 hours. BNP (last 3 results) No results for input(s): BNP in the last 8760 hours.  ProBNP (last 3 results) No results for input(s): PROBNP in the last 8760 hours.  CBG: Recent Labs  Lab 03/10/20 0357 03/10/20 0432 03/10/20 0533 03/10/20 0632 03/10/20 1202  GLUCAP 151* 154* 155* 141* 351*   Recent Results (from the past 240 hour(s))  Resp Panel by RT-PCR (Flu A&B, Covid) Nasopharyngeal Swab     Status: None   Collection Time: 03/09/20  9:18 AM   Specimen: Nasopharyngeal Swab; Nasopharyngeal(NP) swabs in vial transport medium  Result Value Ref Range Status   SARS Coronavirus 2 by RT PCR NEGATIVE NEGATIVE Final    Comment: (NOTE) SARS-CoV-2 target nucleic acids are NOT DETECTED.  The SARS-CoV-2 RNA is generally detectable in upper  respiratory specimens during the acute phase of infection. The lowest concentration of SARS-CoV-2 viral copies this assay can detect is 138 copies/mL. A negative result does not preclude SARS-Cov-2 infection and should not be used as the sole basis for treatment or other patient management decisions. A negative result may occur with  improper specimen collection/handling, submission of specimen other than nasopharyngeal swab, presence of viral mutation(s) within the areas targeted by this assay, and inadequate number of viral copies(<138 copies/mL). A negative result must be combined with clinical observations, patient history, and epidemiological information. The expected result  is Negative.  Fact Sheet for Patients:  BloggerCourse.com  Fact Sheet for Healthcare Providers:  SeriousBroker.it  This test is no t yet approved or cleared by the [place] FDA and  has been authorized for detection and/or diagnosis of SARS-CoV-2 by FDA under an Emergency Use Authorization (EUA). This EUA will remain  in effect (meaning this test can be used) for the duration of the COVID-19 declaration under Section 564(b)(1) of the Act, 21 U.S.C.section 360bbb-3(b)(1), unless the authorization is terminated  or revoked sooner.       Influenza A by PCR NEGATIVE NEGATIVE Final   Influenza B by PCR NEGATIVE NEGATIVE Final    Comment: (NOTE) The Xpert Xpress SARS-CoV-2/FLU/RSV plus assay is intended as an aid in the diagnosis of influenza from Nasopharyngeal swab specimens and should not be used as a sole basis for treatment. Nasal washings and aspirates are unacceptable for Xpert Xpress SARS-CoV-2/FLU/RSV testing.  Fact Sheet for Patients: BloggerCourse.com  Fact Sheet for Healthcare Providers: SeriousBroker.it  This test is not yet approved or cleared by the [place] FDA and has been  authorized for detection and/or diagnosis of SARS-CoV-2 by FDA under an Emergency Use Authorization (EUA). This EUA will remain in effect (meaning this test can be used) for the duration of the COVID-19 declaration under Section 564(b)(1) of the Act, 21 U.S.C. section 360bbb-3(b)(1), unless the authorization is terminated or revoked.  Performed at [facility], [address] [place], [place] [address]   MRSA PCR Screening     Status: None   Collection Time: 03/09/20  5:18 PM   Specimen: Nasal Mucosa; Nasopharyngeal  Result Value Ref Range Status   MRSA by PCR NEGATIVE NEGATIVE Final    Comment:        The GeneXpert MRSA Assay (FDA approved for NASAL specimens only), is one component of a comprehensive MRSA colonization surveillance program. It is not intended to diagnose MRSA infection nor to guide or monitor treatment for MRSA infections. Performed at [facility], [address], [place], [place] [address]      Studies: No results found.    Joycelyn Das, MD  [facility] 03/10/2020  If 7PM-7AM, please contact night-coverage

## 2020-03-10 NOTE — Progress Notes (Addendum)
Inpatient Diabetes Program Recommendations  AACE/ADA: New Consensus Statement on Inpatient Glycemic Control (2015)  Target Ranges:  Prepandial:   less than 140 mg/dL      Peak postprandial:   less than 180 mg/dL (1-2 hours)      Critically ill patients:  140 - 180 mg/dL   Lab Results  Component Value Date   GLUCAP 141 (H) 03/10/2020   HGBA1C 10.5 (H) 03/09/2020    Review of Glycemic Control Results for DOMENIQUE, SOUTHERS (MRN [id number]) as of 03/10/2020 09:38  Ref. Range 03/10/2020 03:57 03/10/2020 04:32 03/10/2020 05:33 03/10/2020 06:32  Glucose-Capillary Latest Ref Range: 70 - 99 mg/dL 151 (H) 154 (H) 155 (H) 141 (H)   Diabetes history: New onset DM Outpatient Diabetes medication: none Current orders for Inpatient glycemic control: IV insulin to transition to Lantus 10 units QD, Novolog 0-9 units TID, Novolog 0-5 units QHS  Inpatient Diabetes Program Recommendations:    Noted new onset DM. In agreement with current orders.   Consult- Dietitian, LWWDM, insulin starter kit. Will plan to speak with patient.  Addendum:  Consider adding Novolog 4 units TID (assuming patient is consuming >50% of meal) and increasing correction to Novolog 0-20 units TID.  Spoke with patient regarding new diabetes diagnosis.  Reviewed patient's current A1c of 10.5%. Explained what a A1c is and what it measures. Also reviewed goal A1c with patient, importance of good glucose control @ home, and blood sugar goals. Reviewed patho of DM, need for insulin, lifestyle modifications, role of pancreas, hypo vs hyper glycemia, interventions, survival skills, vascular changes and comorbidites.  Discussed recommend frequency of CBG checks, target goals and when to call MD.  Patient admits to drinking sugary beverages and consuming large amounts of carbohydrates. Reviewed plate method, importance of being mindful of CHO intake, alternatives to sugary beverages and encouraged cessation. Patient is to follow up with [facility].  Provided glucose meter and information on Relion products. Dietitian to still see patient. Rn has been working with patient on self injection and will plan to continue during hospitalization. At discharge recommend using Novolog 70/30 12 units BID. Feel this will help compliance and patient safety. Educated patient and spouse on insulin pen use at home. Reviewed contents of insulin flexpen starter kit. Reviewed all steps if insulin pen including attachment of needle, 2-unit air shot, dialing up dose, giving injection, removing needle, disposal of sharps, storage of unused insulin, disposal of insulin etc. Patient able to provide successful return demonstration. Also reviewed troubleshooting with insulin pen. MD to give patient Rxs for insulin pens and insulin pen needles.  Secure chat sent to CM regarding use of MATCH in planning for insulin at discharge. Secure chat sent to MD regarding recs.  Thanks, Bronson Curb, MSN, RNC-OB Diabetes Coordinator [phone] (8a-5p)

## 2020-03-10 NOTE — Progress Notes (Signed)
Education was provided to patient and his significant other , Tyrone Jennings. I demonstrated how to administer insulin to himself and how to/when to check his blood sugar. Patient and Tyrone Jennings are to look over and read Diabetes education book and write down any further questions they may have. Diabetes Coordinator is set to come this afternoon with follow-up education.

## 2020-03-11 ENCOUNTER — Other Ambulatory Visit: Payer: Self-pay | Admitting: Internal Medicine

## 2020-03-11 DIAGNOSIS — E118 Type 2 diabetes mellitus with unspecified complications: Secondary | ICD-10-CM

## 2020-03-11 LAB — BASIC METABOLIC PANEL
Anion gap: 11 (ref 5–15)
BUN: 11 mg/dL (ref 6–20)
CO2: 24 mmol/L (ref 22–32)
Calcium: 9.2 mg/dL (ref 8.9–10.3)
Chloride: 99 mmol/L (ref 98–111)
Creatinine, Ser: 0.93 mg/dL (ref 0.61–1.24)
GFR, Estimated: >60 mL/min (ref >60)
Glucose, Bld: 291 mg/dL — ABNORMAL HIGH (ref 70–99)
Potassium: 3.5 mmol/L (ref 3.5–5.1)
Sodium: 134 mmol/L — ABNORMAL LOW (ref 135–145)

## 2020-03-11 LAB — GLUCOSE, CAPILLARY
Glucose-Capillary: 297 mg/dL — ABNORMAL HIGH (ref 70–99)
Glucose-Capillary: 178 mg/dL — ABNORMAL HIGH (ref 70–99)

## 2020-03-11 MED ORDER — INSULIN ASPART PROT & ASPART (70-30 MIX) 100 UNIT/ML ~~LOC~~ SUSP: 16.0000 [IU] | Freq: Two times a day (BID) | SUBCUTANEOUS | 2 refills | Status: DC

## 2020-03-11 MED ORDER — METFORMIN HCL 850 MG PO TABS: 850.0000 mg | ORAL_TABLET | Freq: Two times a day (BID) | ORAL | 2 refills | Status: DC

## 2020-03-11 MED ORDER — "INSULIN SYRINGE 27G X 1/2"" 0.5 ML MISC": 1.0000 "application " | Freq: Two times a day (BID) | 2 refills | Status: DC

## 2020-03-11 MED FILL — metFORMIN HCL 850 MG TABS: 850 | 30 days supply | Qty: 60 | Fill #0

## 2020-03-11 MED FILL — TRUEPLUS SYR 0.3ML 31GX5/16: 31G X 5/16" | 50 days supply | Qty: 100 | Fill #0

## 2020-03-11 MED FILL — NOVOLOG MIX 70/30 VIAL: (70-30) 100 | 31 days supply | Qty: 10 | Fill #0

## 2020-03-11 NOTE — Discharge Instructions (Signed)
Blood Glucose Monitoring, Adult Monitoring your blood sugar (glucose) is an important part of managing your diabetes (diabetes mellitus). Blood glucose monitoring involves checking your blood glucose as often as directed and keeping a record (log) of your results over time. Checking your blood glucose regularly and keeping a blood glucose log can:  Help you and your health care provider adjust your diabetes management plan as needed, including your medicines or insulin.  Help you understand how food, exercise, illnesses, and medicines affect your blood glucose.  Let you know what your blood glucose is at any time. You can quickly find out if you have low blood glucose (hypoglycemia) or high blood glucose (hyperglycemia). Your health care provider will set individualized treatment goals for you. Your goals will be based on your age, other medical conditions you have, and how you respond to diabetes treatment. Generally, the goal of treatment is to maintain the following blood glucose levels:  Before meals (preprandial): 80-130 mg/dL (4.4-7.2 mmol/L).  After meals (postprandial): below 180 mg/dL (10 mmol/L).  A1c level: less than 7%. Supplies needed:  Blood glucose meter.  Test strips for your meter. Each meter has its own strips. You must use the strips that came with your meter.  A needle to prick your finger (lancet). Do not use a lancet more than one time.  A device that holds the lancet (lancing device).  A journal or log book to write down your results. How to check your blood glucose  1. Wash your hands with soap and water. 2. Prick the side of your finger (not the tip) with the lancet. Use a different finger each time. 3. Gently rub the finger until a small drop of blood appears. 4. Follow instructions that come with your meter for inserting the test strip, applying blood to the strip, and using your blood glucose meter. 5. Write down your result and any notes. Some  meters allow you to use areas of your body other than your finger (alternative sites) to test your blood. The most common alternative sites are:  Forearm.  Thigh.  Palm of the hand. If you think you may have hypoglycemia, or if you have a history of not knowing when your blood glucose is getting low (hypoglycemia unawareness), do not use alternative sites. Use your finger instead. Alternative sites may not be as accurate as the fingers, because blood flow is slower in these areas. This means that the result you get may be delayed, and it may be different from the result that you would get from your finger. Follow these instructions at home: Blood glucose log   Every time you check your blood glucose, write down your result. Also write down any notes about things that may be affecting your blood glucose, such as your diet and exercise for the day. This information can help you and your health care provider: ? Look for patterns in your blood glucose over time. ? Adjust your diabetes management plan as needed.  Check if your meter allows you to download your records to a computer. Most glucose meters store a record of glucose readings in the meter. If you have type 1 diabetes:  Check your blood glucose 2 or more times a day.  Also check your blood glucose: ? Before every insulin injection. ? Before and after exercise. ? Before meals. ? 2 hours after a meal. ? Occasionally between 2:00 a.m. and 3:00 a.m., as directed. ? Before potentially dangerous tasks, like driving or using  heavy machinery. ? At bedtime.  You may need to check your blood glucose more often, up to 6-10 times a day, if you: ? Use an insulin pump. ? Need multiple daily injections (MDI). ? Have diabetes that is not well-controlled. ? Are ill. ? Have a history of severe hypoglycemia. ? Have hypoglycemia unawareness. If you have type 2 diabetes:  If you take insulin or other diabetes medicines, check your blood  glucose 2 or more times a day.  If you are on intensive insulin therapy, check your blood glucose 4 or more times a day. Occasionally, you may also need to check between 2:00 a.m. and 3:00 a.m., as directed.  Also check your blood glucose: ? Before and after exercise. ? Before potentially dangerous tasks, like driving or using heavy machinery.  You may need to check your blood glucose more often if: ? Your medicine is being adjusted. ? Your diabetes is not well-controlled. ? You are ill. General tips  Always keep your supplies with you.  If you have questions or need help, all blood glucose meters have a 24-hour "hotline" phone number that you can call. You may also contact your health care provider.  After you use a few boxes of test strips, adjust (calibrate) your blood glucose meter by following instructions that came with your meter. Contact a health care provider if:  Your blood glucose is at or above 240 mg/dL (13.3 mmol/L) for 2 days in a row.  You have been sick or have had a fever for 2 days or longer, and you are not getting better.  You have any of the following problems for more than 6 hours: ? You cannot eat or drink. ? You have nausea or vomiting. ? You have diarrhea. Get help right away if:  Your blood glucose is lower than 54 mg/dL (3 mmol/L).  You become confused or you have trouble thinking clearly.  You have difficulty breathing.  You have moderate or large ketone levels in your urine. Summary  Monitoring your blood sugar (glucose) is an important part of managing your diabetes (diabetes mellitus).  Blood glucose monitoring involves checking your blood glucose as often as directed and keeping a record (log) of your results over time.  Your health care provider will set individualized treatment goals for you. Your goals will be based on your age, other medical conditions you have, and how you respond to diabetes treatment.  Every time you check your  blood glucose, write down your result. Also write down any notes about things that may be affecting your blood glucose, such as your diet and exercise for the day. This information is not intended to replace advice given to you by your health care provider. Make sure you discuss any questions you have with your health care provider. Document Revised: 01/03/2018 Document Reviewed: 08/22/2015 Elsevier Patient Education  [place]. Hypoglycemia Hypoglycemia is when the sugar (glucose) level in your blood is too low. Signs of low blood sugar may include:  Feeling: ? Hungry. ? Worried or nervous (anxious). ? Sweaty and clammy. ? Confused. ? Dizzy. ? Sleepy. ? Sick to your stomach (nauseous).  Having: ? A fast heartbeat. ? A headache. ? A change in your vision. ? Tingling or no feeling (numbness) around your mouth, lips, or tongue. ? Jerky movements that you cannot control (seizure).  Having trouble with: ? Moving (coordination). ? Sleeping. ? Passing out (fainting). ? Getting upset easily (irritability). Low blood sugar can happen to people  who have diabetes and people who do not have diabetes. Low blood sugar can happen quickly, and it can be an emergency. Treating low blood sugar Low blood sugar is often treated by eating or drinking something sugary right away, such as:  Fruit juice, 4-6 oz (120-150 mL).  Regular soda (not diet soda), 4-6 oz (120-150 mL).  Low-fat milk, 4 oz (120 mL).  Several pieces of hard candy.  Sugar or honey, 1 Tbsp (15 mL). Treating low blood sugar if you have diabetes If you can think clearly and swallow safely, follow the 15:15 rule:  Take 15 grams of a fast-acting carb (carbohydrate). Talk with your doctor about how much you should take.  Always keep a source of fast-acting carb with you, such as: ? Sugar tablets (glucose pills). Take 3-4 pills. ? 6-8 pieces of hard candy. ? 4-6 oz (120-150 mL) of fruit juice. ? 4-6 oz (120-150 mL)  of regular (not diet) soda. ? 1 Tbsp (15 mL) honey or sugar.  Check your blood sugar 15 minutes after you take the carb.  If your blood sugar is still at or below 70 mg/dL (3.9 mmol/L), take 15 grams of a carb again.  If your blood sugar does not go above 70 mg/dL (3.9 mmol/L) after 3 tries, get help right away.  After your blood sugar goes back to normal, eat a meal or a snack within 1 hour.  Treating very low blood sugar If your blood sugar is at or below 54 mg/dL (3 mmol/L), you have very low blood sugar (severe hypoglycemia). This may also cause:  Passing out.  Jerky movements you cannot control (seizure).  Losing consciousness (coma). This is an emergency. Do not wait to see if the symptoms will go away. Get medical help right away. Call your local emergency services (911 in the [place] Do not drive yourself to the hospital. If you have very low blood sugar and you cannot eat or drink, you may need a glucagon shot (injection). A family member or friend should learn how to check your blood sugar and how to give you a glucagon shot. Ask your doctor if you need to have a glucagon shot kit at home. Follow these instructions at home: General instructions  Take over-the-counter and prescription medicines only as told by your doctor.  Stay aware of your blood sugar as told by your doctor.  Limit alcohol intake to no more than 1 drink a day for nonpregnant women and 2 drinks a day for men. One drink equals 12 oz of beer (355 mL), 5 oz of wine (148 mL), or 1 oz of hard liquor (44 mL).  Keep all follow-up visits as told by your doctor. This is important. If you have diabetes:   Follow your diabetes care plan as told by your doctor. Make sure you: ? Know the signs of low blood sugar. ? Take your medicines as told. ? Follow your exercise and meal plan. ? Eat on time. Do not skip meals. ? Check your blood sugar as often as told by your doctor. Always check it before and after  exercise. ? Follow your sick day plan when you cannot eat or drink normally. Make this plan ahead of time with your doctor.  Share your diabetes care plan with: ? Your work or school. ? People you live with.  Check your pee (urine) for ketones: ? When you are sick. ? As told by your doctor.  Carry a card or wear jewelry that  says you have diabetes. Contact a doctor if:  You have trouble keeping your blood sugar in your target range.  You have low blood sugar often. Get help right away if:  You still have symptoms after you eat or drink something sugary.  Your blood sugar is at or below 54 mg/dL (3 mmol/L).  You have jerky movements that you cannot control.  You pass out. These symptoms may be an emergency. Do not wait to see if the symptoms will go away. Get medical help right away. Call your local emergency services (911 in the [place] Do not drive yourself to the hospital. Summary  Hypoglycemia happens when the level of sugar (glucose) in your blood is too low.  Low blood sugar can happen to people who have diabetes and people who do not have diabetes. Low blood sugar can happen quickly, and it can be an emergency.  Make sure you know the signs of low blood sugar and know how to treat it.  Always keep a source of sugar (fast-acting carb) with you to treat low blood sugar. This information is not intended to replace advice given to you by your health care provider. Make sure you discuss any questions you have with your health care provider. Document Revised: 07/03/2018 Document Reviewed: 04/15/2015 Elsevier Patient Education  [place]. Hyperglycemia Hyperglycemia occurs when the level of sugar (glucose) in the blood is too high. Glucose is a type of sugar that provides the body's main source of energy. Certain hormones (insulin and glucagon) control the level of glucose in the blood. Insulin lowers blood glucose, and glucagon increases blood glucose. Hyperglycemia  can result from having too little insulin in the bloodstream, or from the body not responding normally to insulin. Hyperglycemia occurs most often in people who have diabetes (diabetes mellitus), but it can happen in people who do not have diabetes. It can develop quickly, and it can be life-threatening if it causes you to become severely dehydrated (diabetic ketoacidosis or hyperglycemic hyperosmolar state). Severe hyperglycemia is a medical emergency. What are the causes? If you have diabetes, hyperglycemia may be caused by:  Diabetes medicine.  Medicines that increase blood glucose or affect your diabetes control.  Not eating enough, or not eating often enough.  Changes in physical activity level.  Being sick or having an infection. If you have prediabetes or undiagnosed diabetes:  Hyperglycemia may be caused by those conditions. If you do not have diabetes, hyperglycemia may be caused by:  Certain medicines, including steroid medicines, beta-blockers, epinephrine, and thiazide diuretics.  Stress.  Serious illness.  Surgery.  Diseases of the pancreas.  Infection. What increases the risk? Hyperglycemia is more likely to develop in people who have risk factors for diabetes, such as:  Having a family member with diabetes.  Having a gene for type 1 diabetes that is passed from parent to child (inherited).  Living in an area with cold weather conditions.  Exposure to certain viruses.  Certain conditions in which the body's disease-fighting (immune) system attacks itself (autoimmune disorders).  Being overweight or obese.  Having an inactive (sedentary) lifestyle.  Having been diagnosed with insulin resistance.  Having a history of prediabetes, gestational diabetes, or polycystic ovarian syndrome (PCOS).  Being of American-Indian, African-American, Hispanic/Latino, or Asian/Pacific Islander descent. What are the signs or symptoms? Hyperglycemia may not cause any  symptoms. If you do have symptoms, they may include early warning signs, such as:  Increased thirst.  Hunger.  Feeling very tired.  Needing  to urinate more often than usual.  Blurry vision. Other symptoms may develop if hyperglycemia gets worse, such as:  Dry mouth.  Loss of appetite.  Fruity-smelling breath.  Weakness.  Unexpected or rapid weight gain or weight loss.  Tingling or numbness in the hands or feet.  Headache.  Skin that does not quickly return to normal after being lightly pinched and released (poor skin turgor).  Abdominal pain.  Cuts or bruises that are slow to heal. How is this diagnosed? Hyperglycemia is diagnosed with a blood test to measure your blood glucose level. This blood test is usually done while you are having symptoms. Your health care provider may also do a physical exam and review your medical history. You may have more tests to determine the cause of your hyperglycemia, such as:  A fasting blood glucose (FBG) test. You will not be allowed to eat (you will fast) for at least 8 hours before a blood sample is taken.  An A1c (hemoglobin A1c) blood test. This provides information about blood glucose control over the previous 2-3 months.  An oral glucose tolerance test (OGTT). This measures your blood glucose at two times: ? After fasting. This is your baseline blood glucose level. ? Two hours after drinking a beverage that contains glucose. How is this treated? Treatment depends on the cause of your hyperglycemia. Treatment may include:  Taking medicine to regulate your blood glucose levels. If you take insulin or other diabetes medicines, your medicine or dosage may be adjusted.  Lifestyle changes, such as exercising more, eating healthier foods, or losing weight.  Treating an illness or infection, if this caused your hyperglycemia.  Checking your blood glucose more often.  Stopping or reducing steroid medicines, if these caused your  hyperglycemia. If your hyperglycemia becomes severe and it results in hyperglycemic hyperosmolar state, you must be hospitalized and given IV fluids. Follow these instructions at home:  General instructions  Take over-the-counter and prescription medicines only as told by your health care provider.  Do not use any products that contain nicotine or tobacco, such as cigarettes and e-cigarettes. If you need help quitting, ask your health care provider.  Limit alcohol intake to no more than 1 drink per day for nonpregnant women and 2 drinks per day for men. One drink equals 12 oz of beer, 5 oz of wine, or 1 oz of hard liquor.  Learn to manage stress. If you need help with this, ask your health care provider.  Keep all follow-up visits as told by your health care provider. This is important. Eating and drinking   Maintain a healthy weight.  Exercise regularly, as directed by your health care provider.  Stay hydrated, especially when you exercise, get sick, or spend time in hot temperatures.  Eat healthy foods, such as: ? Lean proteins. ? Complex carbohydrates. ? Fresh fruits and vegetables. ? Low-fat dairy products. ? Healthy fats.  Drink enough fluid to keep your urine clear or pale yellow. If you have diabetes:  Make sure you know the symptoms of hyperglycemia.  Follow your diabetes management plan, as told by your health care provider. Make sure you: ? Take your insulin and medicines as directed. ? Follow your exercise plan. ? Follow your meal plan. Eat on time, and do not skip meals. ? Check your blood glucose as often as directed. Make sure to check your blood glucose before and after exercise. If you exercise longer or in a different way than usual, check your blood glucose  more often. ? Follow your sick day plan whenever you cannot eat or drink normally. Make this plan in advance with your health care provider.  Share your diabetes management plan with people in your  workplace, school, and household.  Check your urine for ketones when you are ill and as told by your health care provider.  Carry a medical alert card or wear medical alert jewelry. Contact a health care provider if:  Your blood glucose is at or above 240 mg/dL (13.3 mmol/L) for 2 days in a row.  You have problems keeping your blood glucose in your target range.  You have frequent episodes of hyperglycemia. Get help right away if:  You have difficulty breathing.  You have a change in how you think, feel, or act (mental status).  You have nausea or vomiting that does not go away. These symptoms may represent a serious problem that is an emergency. Do not wait to see if the symptoms will go away. Get medical help right away. Call your local emergency services (911 in the [place] Do not drive yourself to the hospital. Summary  Hyperglycemia occurs when the level of sugar (glucose) in the blood is too high.  Hyperglycemia is diagnosed with a blood test to measure your blood glucose level. This blood test is usually done while you are having symptoms. Your health care provider may also do a physical exam and review your medical history.  If you have diabetes, follow your diabetes management plan as told by your health care provider.  Contact your health care provider if you have problems keeping your blood glucose in your target range. This information is not intended to replace advice given to you by your health care provider. Make sure you discuss any questions you have with your health care provider. Document Revised: 11/28/2015 Document Reviewed: 11/28/2015 Elsevier Patient Education  [place]. Hemoglobin A1c Test Why am I having this test? You may have the hemoglobin A1c test (HbA1c test) done to:  Evaluate your risk for developing diabetes (diabetes mellitus).  Diagnose diabetes.  Monitor long-term control of blood sugar (glucose) in people who have diabetes and help  make treatment decisions. This test may be done with other blood glucose tests, such as fasting blood glucose and oral glucose tolerance tests. What is being tested? Hemoglobin is a type of protein in the blood that carries oxygen. Glucose attaches to hemoglobin to form glycated hemoglobin. This test checks the amount of glycated hemoglobin in your blood, which is a good indicator of the average amount of glucose in your blood during the past 2-3 months. What kind of sample is taken?  A blood sample is required for this test. It is usually collected by inserting a needle into a blood vessel. Tell a health care provider about:  All medicines you are taking, including vitamins, herbs, eye drops, creams, and over-the-counter medicines.  Any blood disorders you have.  Any surgeries you have had.  Any medical conditions you have.  Whether you are pregnant or may be pregnant. How are the results reported? Your results will be reported as a percentage that indicates how much of your hemoglobin has glucose attached to it (is glycated). Your health care provider will compare your results to normal ranges that were established after testing a large group of people (reference ranges). Reference ranges may vary among labs and hospitals. For this test, common reference ranges are:  Adult or child without diabetes: 4-5.6%.  Adult or child with  diabetes and good blood glucose control: less than 7%. What do the results mean? If you have diabetes:  A result of less than 7% is considered normal, meaning that your blood glucose is well controlled.  A result higher than 7% means that your blood glucose is not well controlled, and your treatment plan may need to be adjusted. If you do not have diabetes:  A result within the reference range is considered normal, meaning that you are not at high risk for diabetes.  A result of 5.7-6.4% means that you have a high risk of developing diabetes, and you may  have prediabetes. Prediabetes is the condition of having a blood glucose level that is higher than it should be, but not high enough for you to be diagnosed with diabetes. Having prediabetes puts you at risk for developing type 2 diabetes (type 2 diabetes mellitus). You may have more tests, including a repeat HbA1c test.  Results of 6.5% or higher on two separate HbA1c tests mean that you have diabetes. You may have more tests to confirm the diagnosis. Abnormally low HbA1c values may be caused by:  Pregnancy.  Severe blood loss.  Receiving donated blood (transfusions).  Low red blood cell count (anemia).  Long-term kidney failure.  Some unusual forms (variants) of hemoglobin. Talk with your health care provider about what your results mean. Questions to ask your health care provider Ask your health care provider, or the department that is doing the test:  When will my results be ready?  How will I get my results?  What are my treatment options?  What other tests do I need?  What are my next steps? Summary  The hemoglobin A1c test (HbA1c test) may be done to evaluate your risk for developing diabetes, to diagnose diabetes, and to monitor long-term control of blood sugar (glucose) in people who have diabetes and help make treatment decisions.  Hemoglobin is a type of protein in the blood that carries oxygen. Glucose attaches to hemoglobin to form glycated hemoglobin. This test checks the amount of glycated hemoglobin in your blood, which is a good indicator of the average amount of glucose in your blood during the past 2-3 months.  Talk with your health care provider about what your results mean. This information is not intended to replace advice given to you by your health care provider. Make sure you discuss any questions you have with your health care provider. Document Revised: 02/22/2017 Document Reviewed: 10/23/2016 Elsevier Patient Education  [place].

## 2020-03-11 NOTE — Discharge Summary (Signed)
Physician Discharge Summary  Quron Ruddy OYD:[id number] DOB: [birth year] DOA: 03/09/2020  PCP: Patient, No Pcp Per  Admit date: 03/09/2020 Discharge date: 03/11/2020  Admitted From: Home  Discharge disposition: Home   Recommendations for Outpatient Follow-Up:   . Follow up with your primary care provider at the community health clinic on 1/17/ 2022 . Check CBC, BMP, magnesium in the next visit  Discharge Diagnosis:   Principal Problem:   Hyperosmolar hyperglycemic state (HHS) (HCC) New onset diabetes mellitus type 2  Discharge Condition: Improved.  Diet recommendation:   Carbohydrate-modified.    Wound care: None.  Code status: Full.   History of Present Illness:   Tyrone Jennings a 49 y.o.malewith no documented past medical history except for history of carpal tunnel syndrome in the past currently not on any medication presented to the hospital with complaints of frequent urination and excessive thirst for the  2 days prior to presentation. Due to the symptoms,patient presented to [facility] med center where he was noted to have elevated blood glucose level of 1194. Urinalysis showed more than 500 glucose. Patient was diagnosed of having hyperosmolar state and was started on insulin and IV fluids. Patient was then considered for admission admission to stepdown unit for insulin drip.  Hospital Course:   Following conditions were addressed during hospitalization as listed below,  Hyperosmolar hyperglycemic state. Blood glucose level more than 1100 on presentation.   New diagnosis of diabetes.  Hemoglobin A1c of 10.5.  Diabetic coordinator was consulted and patient underwent insulin teaching.  Plan for Novolog 70/30, 16 units twice daily on discharge with Metformin 850 twice daily.   Mild hypokalemia.   Improved with replacement  Pseudohyponatremia.Improved with hydration and glucose control.  Sodium level today at 141.  Mild acute kidney injury. Improved with  IV fluids.   Creatinine of 0.9 prior to discharge  Grade 1 obesity.  Patient was counseled about lifestyle modification increasing activity and weight loss to improve diabetes.  History of carpal tunnel syndrome in the past. Not active at this time.  Disposition.  At this time, patient is stable for disposition home. He was advised to remain on diabetic diet increase activity and make a glucose log. He was advised to follow-up with community clinic as well  Medical Consultants:    None.  Procedures:    None Subjective:   Today, patient was seen and examined at bedside. Denies nausea vomiting shortness of breath cough fever  Discharge Exam:   Vitals:   03/11/20 0110 03/11/20 0518  BP: (!) 128/93 134/86  Pulse: 69 73  Resp: 19 17  Temp: 97.6 F (36.4 C) 97.9 F (36.6 C)  SpO2: 98% 97%   Vitals:   03/10/20 1700 03/10/20 2126 03/11/20 0110 03/11/20 0518  BP: (!) 113/56 136/84 (!) 128/93 134/86  Pulse: 67 71 69 73  Resp:  19 19 17   Temp:  98 F (36.7 C) 97.6 F (36.4 C) 97.9 F (36.6 C)  TempSrc:      SpO2: 99% 99% 98% 97%  Weight:      Height:       General: Alert awake, not in obvious distress, obese HENT: pupils equally reacting to light,  No scleral pallor or icterus noted. Oral mucosa is moist.  Chest:  Clear breath sounds.  Diminished breath sounds bilaterally. No crackles or wheezes.  CVS: S1 &S2 heard. No murmur.  Regular rate and rhythm. Abdomen: Soft, nontender, nondistended.  Bowel sounds are heard.   Extremities: No cyanosis, clubbing  or edema.  Peripheral pulses are palpable. Psych: Alert, awake and oriented, normal mood CNS:  No cranial nerve deficits.  Power equal in all extremities.   Skin: Warm and dry.  No rashes noted.  The results of significant diagnostics from this hospitalization (including imaging, microbiology, ancillary and laboratory) are listed below for reference.     Diagnostic Studies:   No results found.   Labs:   Basic  Metabolic Panel: Recent Labs  Lab 03/09/20 0800 03/09/20 0822 03/09/20 1532 03/09/20 1931 03/10/20 0252 03/11/20 0534  NA 123* 125* 141 143 141 134*  K 4.9 5.0 3.8 3.7 3.4* 3.5  CL 82*  --  100 104 105 99  CO2 24  --  27 29 27 24   GLUCOSE 1,194*  --  286* 222* 170* 291*  BUN 18  --  13 11 10 11   CREATININE 1.62*  --  1.08 1.12 1.10 0.93  CALCIUM 10.8*  --  10.6* 10.5* 9.8 9.2  MG  --   --   --   --  2.2  --   PHOS  --   --   --   --  2.9  --    GFR Estimated Creatinine Clearance: 102.7 mL/min (by C-G formula based on SCr of 0.93 mg/dL). Liver Function Tests: Recent Labs  Lab 03/09/20 0800  AST 23  ALT 33  ALKPHOS 133*  BILITOT 1.3*  PROT 9.7*  ALBUMIN 5.0   Recent Labs  Lab 03/09/20 0800  LIPASE 32   No results for input(s): AMMONIA in the last 168 hours. Coagulation profile No results for input(s): INR, PROTIME in the last 168 hours.  CBC: Recent Labs  Lab 03/09/20 0800 03/09/20 0822 03/09/20 2336 03/10/20 0252  WBC 5.7  --  7.4 6.9  NEUTROABS 3.6  --   --   --   HGB 15.8 16.7 15.0 14.8  HCT 45.5 49.0 43.2 42.7  MCV 86.3  --  84.9 85.9  PLT 275  --  250 237   Cardiac Enzymes: No results for input(s): CKTOTAL, CKMB, CKMBINDEX, TROPONINI in the last 168 hours. BNP: Invalid input(s): POCBNP CBG: Recent Labs  Lab 03/10/20 0632 03/10/20 1202 03/10/20 1632 03/10/20 2127 03/11/20 0732  GLUCAP 141* 351* 289* 191* 297*   D-Dimer No results for input(s): DDIMER in the last 72 hours. Hgb A1c Recent Labs    03/09/20 1931  HGBA1C 10.5*   Lipid Profile No results for input(s): CHOL, HDL, LDLCALC, TRIG, CHOLHDL, LDLDIRECT in the last 72 hours. Thyroid function studies No results for input(s): TSH, T4TOTAL, T3FREE, THYROIDAB in the last 72 hours.  Invalid input(s): FREET3 Anemia work up No results for input(s): VITAMINB12, FOLATE, FERRITIN, TIBC, IRON, RETICCTPCT in the last 72 hours. Microbiology Recent Results (from the past 240 hour(s))   Resp Panel by RT-PCR (Flu A&B, Covid) Nasopharyngeal Swab     Status: None   Collection Time: 03/09/20  9:18 AM   Specimen: Nasopharyngeal Swab; Nasopharyngeal(NP) swabs in vial transport medium  Result Value Ref Range Status   SARS Coronavirus 2 by RT PCR NEGATIVE NEGATIVE Final    Comment: (NOTE) SARS-CoV-2 target nucleic acids are NOT DETECTED.  The SARS-CoV-2 RNA is generally detectable in upper respiratory specimens during the acute phase of infection. The lowest concentration of SARS-CoV-2 viral copies this assay can detect is 138 copies/mL. A negative result does not preclude SARS-Cov-2 infection and should not be used as the sole basis for treatment or other patient management decisions. A  negative result may occur with  improper specimen collection/handling, submission of specimen other than nasopharyngeal swab, presence of viral mutation(s) within the areas targeted by this assay, and inadequate number of viral copies(<138 copies/mL). A negative result must be combined with clinical observations, patient history, and epidemiological information. The expected result is Negative.  Fact Sheet for Patients:  BloggerCourse.com  Fact Sheet for Healthcare Providers:  SeriousBroker.it  This test is no t yet approved or cleared by the [place] FDA and  has been authorized for detection and/or diagnosis of SARS-CoV-2 by FDA under an Emergency Use Authorization (EUA). This EUA will remain  in effect (meaning this test can be used) for the duration of the COVID-19 declaration under Section 564(b)(1) of the Act, 21 U.S.C.section 360bbb-3(b)(1), unless the authorization is terminated  or revoked sooner.       Influenza A by PCR NEGATIVE NEGATIVE Final   Influenza B by PCR NEGATIVE NEGATIVE Final    Comment: (NOTE) The Xpert Xpress SARS-CoV-2/FLU/RSV plus assay is intended as an aid in the diagnosis of influenza from  Nasopharyngeal swab specimens and should not be used as a sole basis for treatment. Nasal washings and aspirates are unacceptable for Xpert Xpress SARS-CoV-2/FLU/RSV testing.  Fact Sheet for Patients: BloggerCourse.com  Fact Sheet for Healthcare Providers: SeriousBroker.it  This test is not yet approved or cleared by the [place] FDA and has been authorized for detection and/or diagnosis of SARS-CoV-2 by FDA under an Emergency Use Authorization (EUA). This EUA will remain in effect (meaning this test can be used) for the duration of the COVID-19 declaration under Section 564(b)(1) of the Act, 21 U.S.C. section 360bbb-3(b)(1), unless the authorization is terminated or revoked.  Performed at [facility], [address] [place], [place] [address]   MRSA PCR Screening     Status: None   Collection Time: 03/09/20  5:18 PM   Specimen: Nasal Mucosa; Nasopharyngeal  Result Value Ref Range Status   MRSA by PCR NEGATIVE NEGATIVE Final    Comment:        The GeneXpert MRSA Assay (FDA approved for NASAL specimens only), is one component of a comprehensive MRSA colonization surveillance program. It is not intended to diagnose MRSA infection nor to guide or monitor treatment for MRSA infections. Performed at [facility], [address] [place], [place] [address]      Discharge Instructions:   Discharge Instructions    Diet Carb Modified   Complete by: As directed    Discharge instructions   Complete by: As directed    Follow-up with your primary care provider as has been scheduled.  Check blood glucose levels twice a day-fasting and 2 hours after lunch or dinner every day.  Make a log of  blood glucose levels.  Follow diabetic diet.  Increase activity.  Use insulin as prescribed.  You will need to continue insulin until you see you discuss with your family doctor.   Increase activity slowly    Complete by: As directed      Allergies as of 03/11/2020      Reactions   Other Other (See Comments)   Clorox. "flares me up."      Medication List    STOP taking these medications   cyclobenzaprine 10 MG tablet Commonly known as: FLEXERIL   HYDROcodone-acetaminophen 5-325 MG tablet Commonly known as: NORCO/VICODIN   ibuprofen 200 MG tablet Commonly known as: ADVIL   predniSONE 50 MG tablet Commonly known as: DELTASONE  vitamin C 1000 MG tablet     TAKE these medications   insulin aspart protamine- aspart (70-30) 100 UNIT/ML injection Commonly known as: NOVOLOG MIX 70/30 Inject 0.16 mLs (16 Units total) into the skin 2 (two) times daily with a meal.   Insulin Syringe 27G X 1/2" 0.5 ML Misc 1 application by Does not apply route in the morning and at bedtime.   metFORMIN 850 MG tablet Commonly known as: Glucophage Take 1 tablet (850 mg total) by mouth 2 (two) times daily with a meal.       Follow-up Information     [facility]. Go on 04/11/2020.   Why: go to clinic to DR, Annett Gulaebroha Johnson at 930am  Please b e there by 915am to complete paperwork. Contact information: [address] [place] [id number] [phone]               Time coordinating discharge: 39 minutes  Signed:  Damein Gaunce  [facility] 03/11/2020, 11:58 AM

## 2020-03-11 NOTE — Progress Notes (Signed)
Nutrition Note  RD consulted for nutrition education regarding diabetes.   Lab Results  Component Value Date   HGBA1C 10.5 (H) 03/09/2020    RD provided "Carbohydrate Counting for People with Diabetes" handout from the Academy of Nutrition and Dietetics. Discussed different food groups and their effects on blood sugar, emphasizing carbohydrate-containing foods. Provided list of carbohydrates and recommended serving sizes of common foods.  Discussed importance of controlled and consistent carbohydrate intake throughout the day. Provided examples of ways to balance meals/snacks and encouraged intake of high-fiber, whole grain complex carbohydrates. Teach back method used.  Expect fair compliance. Pt with not many questions for RD. No family at bedside. Does plan to consume sugar-free sodas and Gatorade from now on.   Body mass index is 34.7 kg/m. Pt meets criteria for obesity based on current BMI.  Current diet order is Heart Healthy/CHO modified, patient is consuming approximately 100% of meals at this time. Labs and medications reviewed. No further nutrition interventions warranted at this time. If additional nutrition issues arise, please re-consult RD.  Tilda Franco, MS, RD, LDN Inpatient Clinical Dietitian Contact information available via Amion

## 2020-03-11 NOTE — Plan of Care (Signed)
Instructions were reviewed with patient. All questions were answered. Patient was transported to main entrance by wheelchair. ° °

## 2020-03-11 NOTE — Progress Notes (Signed)
Inpatient Diabetes Program Recommendations  AACE/ADA: New Consensus Statement on Inpatient Glycemic Control (2015)  Target Ranges:  Prepandial:   less than 140 mg/dL      Peak postprandial:   less than 180 mg/dL (1-2 hours)      Critically ill patients:  140 - 180 mg/dL   Lab Results  Component Value Date   GLUCAP 297 (H) 03/11/2020   HGBA1C 10.5 (H) 03/09/2020    Review of Glycemic Control Results for Tyrone Jennings, Tyrone Jennings (MRN [id number]) as of 03/11/2020 08:27  Ref. Range 03/10/2020 16:32 03/10/2020 21:27 03/11/2020 07:32  Glucose-Capillary Latest Ref Range: 70 - 99 mg/dL 567 (H) 014 (H) 103 (H)   Diabetes history: New onset DM Outpatient Diabetes medication: none Current orders for Inpatient glycemic control: Novolog 70/30 12 units BID, Novolog 0-20 units TID, Novolog 0-5 units QHS  Inpatient Diabetes Program Recommendations:    Given current trends consider Novolog 16 units BID at discharge.   Thanks, Lujean Rave, MSN, RNC-OB Diabetes Coordinator [phone] (8a-5p)

## 2020-03-11 NOTE — TOC Transition Note (Signed)
Transition of Care [facility]) - CM/SW Discharge Note   Patient Details  Name: Tyrone Jennings MRN: [id number] Date of Birth: December 08, 1970  Transition of [facility]) CM/SW Contact:  Amada Jupiter, LCSW Phone Number: 03/11/2020, 11:58 AM   Clinical Narrative:     Pt medically cleared for dc today.  He has new patient appt at [facility] on 04/11/20 @ 9:30.  Prescriptions to be submitted to the [facility] pharmacy today for a one - time free fill of medications.  Have provided directions and instructions to patient.  No further TOC needs.  Final next level of care: Home/Self Care Barriers to Discharge: Barriers Resolved   Patient Goals and CMS Choice Patient states their goals for this hospitalization and ongoing recovery are:: (P) to go home CMS Medicare.gov Compare Post Acute Care list provided to:: (P) Patient Choice offered to / list presented to : (P) Patient  Discharge Placement                       Discharge Plan and Services   Discharge Planning Services: (P) CM Consult,Follow-up appt scheduled,[facility]            DME Arranged: N/A DME Agency: NA                  Social Determinants of Health (SDOH) Interventions     Readmission Risk Interventions No flowsheet data found.

## 2020-03-17 LAB — GLUCOSE, CAPILLARY: Glucose-Capillary: 192 mg/dL — ABNORMAL HIGH (ref 70–99)

## 2020-04-11 ENCOUNTER — Ambulatory Visit: Payer: Self-pay | Attending: Internal Medicine | Admitting: Internal Medicine

## 2020-04-11 ENCOUNTER — Other Ambulatory Visit: Payer: Self-pay | Admitting: Internal Medicine

## 2020-04-11 ENCOUNTER — Other Ambulatory Visit: Payer: Self-pay

## 2020-04-11 DIAGNOSIS — Z114 Encounter for screening for human immunodeficiency virus [HIV]: Secondary | ICD-10-CM

## 2020-04-11 DIAGNOSIS — R7989 Other specified abnormal findings of blood chemistry: Secondary | ICD-10-CM | POA: Insufficient documentation

## 2020-04-11 DIAGNOSIS — Z7689 Persons encountering health services in other specified circumstances: Secondary | ICD-10-CM

## 2020-04-11 DIAGNOSIS — E1169 Type 2 diabetes mellitus with other specified complication: Secondary | ICD-10-CM

## 2020-04-11 DIAGNOSIS — Z289 Immunization not carried out for unspecified reason: Secondary | ICD-10-CM

## 2020-04-11 DIAGNOSIS — R945 Abnormal results of liver function studies: Secondary | ICD-10-CM

## 2020-04-11 DIAGNOSIS — E669 Obesity, unspecified: Secondary | ICD-10-CM

## 2020-04-11 DIAGNOSIS — Z1159 Encounter for screening for other viral diseases: Secondary | ICD-10-CM

## 2020-04-11 MED ORDER — METFORMIN HCL 500 MG PO TABS: 500.0000 mg | ORAL_TABLET | Freq: Every day | ORAL | 1 refills | Status: DC

## 2020-04-11 MED ORDER — INSULIN SYRINGE 27G X 1/2" 0.5 ML MISC
1.0000 | Freq: Two times a day (BID) | 2 refills | Status: AC
Start: 2020-04-11 — End: 2020-07-10

## 2020-04-11 MED ORDER — INSULIN ASPART PROT & ASPART (70-30 MIX) 100 UNIT/ML ~~LOC~~ SUSP
18.0000 [IU] | Freq: Two times a day (BID) | SUBCUTANEOUS | 6 refills | Status: DC
  Filled 2020-06-29 (×2): qty 10, 28d supply, fill #0
  Filled 2020-07-27: qty 30, 84d supply, fill #1
  Filled 2020-07-27: qty 10, 28d supply, fill #1

## 2020-04-11 MED FILL — NOVOLOG MIX 70/30 VIAL: (70-30) 100 | 27 days supply | Qty: 10 | Fill #0

## 2020-04-11 MED FILL — ULTICARE SYRIN 0.5 ML 28GX1: 28G X 1/2" | 50 days supply | Qty: 100 | Fill #0

## 2020-04-11 MED FILL — METFORMIN HCL 500 MG TABS: 500 | 30 days supply | Qty: 30 | Fill #0

## 2020-04-11 NOTE — Progress Notes (Signed)
Virtual Visit via Telephone Note This visit was changed to a telephone visit due to inclement weather and the clinic being closed. I connected with Tyrone Jennings on 04/11/20 at 9:57 a.m by telephone and verified that I am speaking with the correct person using two identifiers.  Location: Patient: home Provider: office  The pt, my CMA and myself participated in this encounter I discussed the limitations, risks, security and privacy concerns of performing an evaluation and management service by telephone and the availability of in person appointments. I also discussed with the patient that there may be a patient responsible charge related to this service. The patient expressed understanding and agreed to proceed.   History of Present Illness: She has history of newly diagnosed diabetes, obesity and CTS.  This is a new patient visit for hospital follow-up.  Hospitalized 12/15-2017/2021 with new onset diabetes.  He presented with complaints of polyuria and polydipsia for 2 days.  Blood sugar was 1194 with glucose present in the urine.  A1C was 10.5.  Patient was treated for hyperosmolar hyperglycemic state with insulin and IV fluids.  He was seen by the diabetic coordinator and received insulin teaching.  He was started on NovoLog 70/30 16 units twice a day and metformin 850 mg twice a day.  Today:  No previous PCP. Pt reports compliance with Metformin and Novolog 16 units BID Reports significant diarrhea when he takes Metformin. Check BS TID before meals.  Gives range 100-111.  144 was the highest which he got this a.m. Lowest BS was 70s  - only once since dischg and he did feel it. -Met with diabetic co-ordinator while in hospital. reports changes in eating habits - no soft drinks or juices, candies, cookies.  He stopped eating fried foods.  Changed to wheat bread. Walking daily. -endorses blurred vision 1 wk for a few days. No insurance for eye exam  HM:  Has not had COVID vaccine but would like  to get it.  Observations/Objective:   Chemistry      Component Value Date/Time   NA 134 (L) 03/11/2020 0534   K 3.5 03/11/2020 0534   CL 99 03/11/2020 0534   CO2 24 03/11/2020 0534   BUN 11 03/11/2020 0534   CREATININE 0.93 03/11/2020 0534      Component Value Date/Time   CALCIUM 9.2 03/11/2020 0534   ALKPHOS 133 (H) 03/09/2020 0800   AST 23 03/09/2020 0800   ALT 33 03/09/2020 0800   BILITOT 1.3 (H) 03/09/2020 0800     Lab Results  Component Value Date   WBC 6.9 03/10/2020   HGB 14.8 03/10/2020   HCT 42.7 03/10/2020   MCV 85.9 03/10/2020   PLT 237 03/10/2020     Assessment and Plan: 1. Establishing care with new doctor, encounter for   2. Type 2 diabetes mellitus with obesity ([place]) Commended him on changes made in his eating habits.  Additional dietary counseling given.  Advised to cut back on portion sizes of white carbohydrates, eat more lean white meat instead of red meat and to eliminate sugary drinks from the diet.  Continue daily exercise.  Given diarrhea that he is having with metformin, I recommend decreasing the dose of metformin to 500 mg once a day and increasing the dose of the NovoLog 70/30 from 16 units daily to 18 units daily.  Continue to monitor blood sugars.  Went over signs and symptoms of hypoglycemia and how to treat it. Encourage him to get diabetic eye exam at least  once a year.  He is uninsured.  I have informed him of cost-effective places to get eye exam like [place] / creatinine urine ratio; Future - Lipid panel; Future - metFORMIN (GLUCOPHAGE) 500 MG tablet; Take 1 tablet (500 mg total) by mouth daily with breakfast.  Dispense: 90 tablet; Refill: 1 - insulin aspart protamine- aspart (NOVOLOG MIX 70/30) (70-30) 100 UNIT/ML injection; Inject 0.18 mLs (18 Units total) into the skin 2 (two) times daily with a meal.  Dispense: 10 mL; Refill: 6 - Insulin Syringe 27G X 1/2" 0.5 ML MISC; 1 application by Does not apply route in  the morning and at bedtime.  Dispense: 180 each; Refill: 2  3. Abnormal LFTs There was elevation in bilirubin, alkaline phosphatase and total protein.  We will recheck the panel and hepatitis C screening. - Hepatic function panel; Future  4. Need for hepatitis C screening test - Hepatitis C Antibody; Future  5. Screening for HIV (human immunodeficiency virus) Patient agreeable to screening. - HIV Antibody (routine testing w rflx); Future  6.  Need for flu shot And will come later this week to meet with the clinical pharmacist to get his flu shot.  He will also stop by the lab to have blood test done that were ordered.  7.  COVID-19 vaccine not done He would like to get the COVID-19 vaccine.  I have submitted his name so that he can be called by [facility] to get the vaccine.  Follow Up Instructions: 2 mths He will come on Friday of this week to have flu vaccine administered by the clinical pharmacist and will stop at lab to have his blood test done.   I discussed the assessment and treatment plan with the patient. The patient was provided an opportunity to ask questions and all were answered. The patient agreed with the plan and demonstrated an understanding of the instructions.   The patient was advised to call back or seek an in-person evaluation if the symptoms worsen or if the condition fails to improve as anticipated.  I provided 16 minutes of non-face-to-face time during this encounter.   Karle Plumber, MD

## 2020-04-11 NOTE — Progress Notes (Signed)
Pt states his blood sugar was 144 before breakfast   Pt is needing a refill on insulin

## 2020-04-12 ENCOUNTER — Telehealth: Payer: Self-pay | Admitting: Internal Medicine

## 2020-04-12 NOTE — Telephone Encounter (Signed)
Pt stated he will get his vaccine this Friday at [facility]

## 2020-04-15 ENCOUNTER — Ambulatory Visit: Payer: Self-pay | Admitting: Pharmacist

## 2020-04-18 ENCOUNTER — Ambulatory Visit: Payer: Self-pay | Admitting: Pharmacist

## 2020-05-09 MED FILL — TRUEPLUS SYR 0.3ML 31GX5/16: 31G X 5/16" | 50 days supply | Qty: 100 | Fill #1

## 2020-05-09 MED FILL — !NOVOLOG MIX 70/30 VIAL: 70-30/ML | 27 days supply | Qty: 10 | Fill #1

## 2020-06-16 ENCOUNTER — Other Ambulatory Visit: Payer: Self-pay

## 2020-06-16 ENCOUNTER — Encounter: Payer: Self-pay | Admitting: Internal Medicine

## 2020-06-16 ENCOUNTER — Ambulatory Visit: Payer: Self-pay | Attending: Internal Medicine | Admitting: Internal Medicine

## 2020-06-16 VITALS — BP 124/81 | HR 73 | Resp 16 | Wt 210.0 lb

## 2020-06-16 DIAGNOSIS — Z114 Encounter for screening for human immunodeficiency virus [HIV]: Secondary | ICD-10-CM

## 2020-06-16 DIAGNOSIS — R7989 Other specified abnormal findings of blood chemistry: Secondary | ICD-10-CM

## 2020-06-16 DIAGNOSIS — E1169 Type 2 diabetes mellitus with other specified complication: Secondary | ICD-10-CM

## 2020-06-16 DIAGNOSIS — R945 Abnormal results of liver function studies: Secondary | ICD-10-CM

## 2020-06-16 DIAGNOSIS — Z1211 Encounter for screening for malignant neoplasm of colon: Secondary | ICD-10-CM

## 2020-06-16 DIAGNOSIS — R768 Other specified abnormal immunological findings in serum: Secondary | ICD-10-CM

## 2020-06-16 DIAGNOSIS — Z2821 Immunization not carried out because of patient refusal: Secondary | ICD-10-CM

## 2020-06-16 DIAGNOSIS — Z1159 Encounter for screening for other viral diseases: Secondary | ICD-10-CM

## 2020-06-16 DIAGNOSIS — E669 Obesity, unspecified: Secondary | ICD-10-CM

## 2020-06-16 LAB — GLUCOSE, POCT (MANUAL RESULT ENTRY): POC Glucose: 100 mg/dl — AB (ref 70–99)

## 2020-06-16 LAB — POCT GLYCOSYLATED HEMOGLOBIN (HGB A1C): HbA1c, POC (controlled diabetic range): 5.9 % (ref 0.0–7.0)

## 2020-06-16 NOTE — Patient Instructions (Addendum)
Stop Metformin. Continue NovoLog 70/30 18 units twice a daily.  If your blood sugars before meals start to increase above 140, you can increase the NovoLog insulin to 20 units twice a day.  Try to get your eye exam done when you are able to afford.  Asked the front desk for forms for the orange card/cone discount card.   Diabetes Mellitus and Nutrition, Adult When you have diabetes, or diabetes mellitus, it is very important to have healthy eating habits because your blood sugar (glucose) levels are greatly affected by what you eat and drink. Eating healthy foods in the right amounts, at about the same times every day, can help you:  Control your blood glucose.  Lower your risk of heart disease.  Improve your blood pressure.  Reach or maintain a healthy weight. What can affect my meal plan? Every person with diabetes is different, and each person has different needs for a meal plan. Your health care provider may recommend that you work with a dietitian to make a meal plan that is best for you. Your meal plan may vary depending on factors such as:  The calories you need.  The medicines you take.  Your weight.  Your blood glucose, blood pressure, and cholesterol levels.  Your activity level.  Other health conditions you have, such as heart or kidney disease. How do carbohydrates affect me? Carbohydrates, also called carbs, affect your blood glucose level more than any other type of food. Eating carbs naturally raises the amount of glucose in your blood. Carb counting is a method for keeping track of how many carbs you eat. Counting carbs is important to keep your blood glucose at a healthy level, especially if you use insulin or take certain oral diabetes medicines. It is important to know how many carbs you can safely have in each meal. This is different for every person. Your dietitian can help you calculate how many carbs you should have at each meal and for each snack. How does  alcohol affect me? Alcohol can cause a sudden decrease in blood glucose (hypoglycemia), especially if you use insulin or take certain oral diabetes medicines. Hypoglycemia can be a life-threatening condition. Symptoms of hypoglycemia, such as sleepiness, dizziness, and confusion, are similar to symptoms of having too much alcohol.  Do not drink alcohol if: ? Your health care provider tells you not to drink. ? You are pregnant, may be pregnant, or are planning to become pregnant.  If you drink alcohol: ? Do not drink on an empty stomach. ? Limit how much you use to:  0-1 drink a day for women.  0-2 drinks a day for men. ? Be aware of how much alcohol is in your drink. In the [place] one drink equals one 12 oz bottle of beer (355 mL), one 5 oz glass of wine (148 mL), or one 1 oz glass of hard liquor (44 mL). ? Keep yourself hydrated with water, diet soda, or unsweetened iced tea.  Keep in mind that regular soda, juice, and other mixers may contain a lot of sugar and must be counted as carbs. What are tips for following this plan? Reading food labels  Start by checking the serving size on the "Nutrition Facts" label of packaged foods and drinks. The amount of calories, carbs, fats, and other nutrients listed on the label is based on one serving of the item. Many items contain more than one serving per package.  Check the total grams (g) of carbs in  one serving. You can calculate the number of servings of carbs in one serving by dividing the total carbs by 15. For example, if a food has 30 g of total carbs per serving, it would be equal to 2 servings of carbs.  Check the number of grams (g) of saturated fats and trans fats in one serving. Choose foods that have a low amount or none of these fats.  Check the number of milligrams (mg) of salt (sodium) in one serving. Most people should limit total sodium intake to less than 2,300 mg per day.  Always check the nutrition information of foods  labeled as "low-fat" or "nonfat." These foods may be higher in added sugar or refined carbs and should be avoided.  Talk to your dietitian to identify your daily goals for nutrients listed on the label. Shopping  Avoid buying canned, pre-made, or processed foods. These foods tend to be high in fat, sodium, and added sugar.  Shop around the outside edge of the grocery store. This is where you will most often find fresh fruits and vegetables, bulk grains, fresh meats, and fresh dairy. Cooking  Use low-heat cooking methods, such as baking, instead of high-heat cooking methods like deep frying.  Cook using healthy oils, such as olive, canola, or sunflower oil.  Avoid cooking with butter, cream, or high-fat meats. Meal planning  Eat meals and snacks regularly, preferably at the same times every day. Avoid going long periods of time without eating.  Eat foods that are high in fiber, such as fresh fruits, vegetables, beans, and whole grains. Talk with your dietitian about how many servings of carbs you can eat at each meal.  Eat 4-6 oz (112-168 g) of lean protein each day, such as lean meat, chicken, fish, eggs, or tofu. One ounce (oz) of lean protein is equal to: ? 1 oz (28 g) of meat, chicken, or fish. ? 1 egg. ?  cup (62 g) of tofu.  Eat some foods each day that contain healthy fats, such as avocado, nuts, seeds, and fish.   What foods should I eat? Fruits Berries. Apples. Oranges. Peaches. Apricots. Plums. Grapes. Mango. Papaya. Pomegranate. Kiwi. Cherries. Vegetables Lettuce. Spinach. Leafy greens, including kale, chard, collard greens, and mustard greens. Beets. Cauliflower. Cabbage. Broccoli. Carrots. Green beans. Tomatoes. Peppers. Onions. Cucumbers. Brussels sprouts. Grains Whole grains, such as whole-wheat or whole-grain bread, crackers, tortillas, cereal, and pasta. Unsweetened oatmeal. Quinoa. Macchi or wild rice. Meats and other proteins Seafood. Poultry without skin. Lean  cuts of poultry and beef. Tofu. Nuts. Seeds. Dairy Low-fat or fat-free dairy products such as milk, yogurt, and cheese. The items listed above may not be a complete list of foods and beverages you can eat. Contact a dietitian for more information. What foods should I avoid? Fruits Fruits canned with syrup. Vegetables Canned vegetables. Frozen vegetables with butter or cream sauce. Grains Refined white flour and flour products such as bread, pasta, snack foods, and cereals. Avoid all processed foods. Meats and other proteins Fatty cuts of meat. Poultry with skin. Breaded or fried meats. Processed meat. Avoid saturated fats. Dairy Full-fat yogurt, cheese, or milk. Beverages Sweetened drinks, such as soda or iced tea. The items listed above may not be a complete list of foods and beverages you should avoid. Contact a dietitian for more information. Questions to ask a health care provider  Do I need to meet with a diabetes educator?  Do I need to meet with a dietitian?  What number can  I call if I have questions?  When are the best times to check my blood glucose? Where to find more information:  American Diabetes Association: diabetes.org  Academy of Nutrition and Dietetics: www.eatright.[employer] of Diabetes and Digestive and Kidney Diseases: CarFlippers.tn  Association of Diabetes Care and Education Specialists: www.diabeteseducator.org Summary  It is important to have healthy eating habits because your blood sugar (glucose) levels are greatly affected by what you eat and drink.  A healthy meal plan will help you control your blood glucose and maintain a healthy lifestyle.  Your health care provider may recommend that you work with a dietitian to make a meal plan that is best for you.  Keep in mind that carbohydrates (carbs) and alcohol have immediate effects on your blood glucose levels. It is important to count carbs and to use alcohol carefully. This  information is not intended to replace advice given to you by your health care provider. Make sure you discuss any questions you have with your health care provider. Document Revised: 02/17/2019 Document Reviewed: 02/17/2019 Elsevier Patient Education  2021 [employer].

## 2020-06-16 NOTE — Progress Notes (Addendum)
Patient ID: Tyrone Jennings, male    DOB: May 01, 1970  MRN: [id number]  CC: Diabetes   Subjective: Tyrone Jennings is a 50 y.o. male who presents for chronic ds management His concerns today include:  Patient with history of diabetes type 2, obesity, CTS, abnormal LFTs  DIABETES TYPE 2/obesity Last A1C:   Results for orders placed or performed in visit on 06/16/20  POCT glucose (manual entry)  Result Value Ref Range   POC Glucose 100 (A) 70 - 99 mg/dl  POCT glycosylated hemoglobin (Hb A1C)  Result Value Ref Range   Hemoglobin A1C     HbA1c POC (<> result, manual entry)     HbA1c, POC (prediabetic range)     HbA1c, POC (controlled diabetic range) 5.9 0.0 - 7.0 %    Med Adherence:  [x]  Yes patient is on NovoLog 70/30 18 units twice a day and Metformin 500 mg daily.  Metformin dose was decreased on last visit due to diarrhea. Medication side effects:  [x]  Yes -still having diarrhea with Metformin even with lower daily dose Home Monitoring?  [x]  Yes  -patient brings log book with him. Home glucose results range:Before BF 99-130, before lunch 88-137, before dinner 81-154. Diet Adherence: [x]  Yes -"I'm trying to stay away from all sugars."  Weight is down 5 pounds since December of last year. Exercise: [x]  Yes  -walking 30 minutes total daily Hypoglycemic episodes?: [x]  Yes -one time BS was 74.  He was able to tell BS was low and treat appropriately. Numbness of the feet? []  Yes    [x]  No Retinopathy hx? []  Yes    []  No Last eye exam:  Not able to get eye exam as yet.  No insurance and denied Medicaid. Comments: Wanting to apply for disability because of diagnosis of diabetes.  Abnormal LFTs discussed on last visit.  He was supposed to come to the lab to have blood test done but did not make it.  He is agreeable to having the labs done today.  He does not drink or use any street drugs.  He is agreeable to hepatitis C and HIV screening as were ordered on last visit.  HM:  Over due for colon  CA screen.  No changes in bowel movements.  No blood in the stools.  He declines flu shot and Pneumovax.  Diabetic foot exam done today. Patient Active Problem List   Diagnosis Date Noted  . Establishing care with new doctor, encounter for 04/11/2020  . Type 2 diabetes mellitus with obesity (HCC) 04/11/2020  . Abnormal LFTs 04/11/2020  . Hyperosmolar hyperglycemic state (HHS) (HCC) 03/09/2020     Current Outpatient Medications on File Prior to Visit  Medication Sig Dispense Refill  . insulin aspart protamine- aspart (NOVOLOG MIX 70/30) (70-30) 100 UNIT/ML injection Inject 0.18 mLs (18 Units total) into the skin 2 (two) times daily with a meal. 10 mL 6  . Insulin Syringe 27G X 1/2" 0.5 ML MISC 1 application by Does not apply route in the morning and at bedtime. 180 each 2   No current facility-administered medications on file prior to visit.    Allergies  Allergen Reactions  . Other Other (See Comments)    Clorox. "flares me up."    Social History   Socioeconomic History  . Marital status: Single    Spouse name: Not on file  . Number of children: Not on file  . Years of education: Not on file  . Highest education level: Not  on file  Occupational History  . Not on file  Tobacco Use  . Smoking status: Never Smoker  . Smokeless tobacco: Never Used  Substance and Sexual Activity  . Alcohol use: No  . Drug use: No  . Sexual activity: Not on file  Other Topics Concern  . Not on file  Social History Narrative  . Not on file   Social Determinants of Health   Financial Resource Strain: Not on file  Food Insecurity: Not on file  Transportation Needs: Not on file  Physical Activity: Not on file  Stress: Not on file  Social Connections: Not on file  Intimate Partner Violence: Not on file    Family History  Problem Relation Age of Onset  . Diabetes Other     No past surgical history on file.  ROS: Review of Systems Negative except as stated above  PHYSICAL  EXAM: BP 124/81   Pulse 73   Resp 16   Wt 210 lb (95.3 kg)   SpO2 96%   BMI 33.89 kg/m   Wt Readings from Last 3 Encounters:  06/16/20 210 lb (95.3 kg)  03/09/20 215 lb (97.5 kg)  07/11/17 205 lb (93 kg)    Physical Exam  General appearance - alert, well appearing, middle-aged African-American male and in no distress Mental status - normal mood, behavior, speech, dress, motor activity, and thought processes Neck - supple, no significant adenopathy.  No carotid bruits. Chest - clear to auscultation, no wheezes, rales or rhonchi, symmetric air entry Heart - normal rate, regular rhythm, normal S1, S2, no murmurs, rubs, clicks or gallops Extremities - peripheral pulses normal, no pedal edema, no clubbing or cyanosis Diabetic Foot Exam - Simple   Simple Foot Form Visual Inspection See comments: Yes Sensation Testing Intact to touch and monofilament testing bilaterally: Yes Pulse Check Posterior Tibialis and Dorsalis pulse intact bilaterally: Yes Comments Toenails are thickened discolored.  Skin on the perimeter of both feet has mild peeling     CMP Latest Ref Rng & Units 03/11/2020 03/10/2020 03/09/2020  Glucose 70 - 99 mg/dL 921(J) 941(D) 408(X)  BUN 6 - 20 mg/dL 11 10 11   Creatinine 0.61 - 1.24 mg/dL 4.48 1.85  Sodium 135 - 145 mmol/L 134(L) 141 143  Potassium 3.5 - 5.1 mmol/L 3.5 3.4(L) 3.7  Chloride 98 - 111 mmol/L 99 105 104  CO2 22 - 32 mmol/L 24 27 29   Calcium 8.9 - 10.3 mg/dL 9.2 9.8 10.5(H)  Total Protein 6.5 - 8.1 g/dL - - -  Total Bilirubin 0.3 - 1.2 mg/dL - - -  Alkaline Phos 38 - 126 U/L - - -  AST 15 - 41 U/L - - -  ALT 0 - 44 U/L - - -   Lab Results  Component Value Date   ALT 33 03/09/2020   AST 23 03/09/2020   ALKPHOS 133 (H) 03/09/2020   BILITOT 1.3 (H) 03/09/2020    CBC    Component Value Date/Time   WBC 6.9 03/10/2020 0252   RBC 4.97 03/10/2020 0252   HGB 14.8 03/10/2020 0252   HCT 42.7 03/10/2020 0252   PLT 237 03/10/2020 0252    MCV 85.9 03/10/2020 0252   MCH 29.8 03/10/2020 0252   MCHC 34.7 03/10/2020 0252   RDW 11.9 03/10/2020 0252   LYMPHSABS 1.6 03/09/2020 0800   MONOABS 0.4 03/09/2020 0800   EOSABS 0.0 03/09/2020 0800   BASOSABS 0.0 03/09/2020 0800    ASSESSMENT AND PLAN:  1. Type  2 diabetes mellitus with obesity (HCC) Stop Metformin due to diarrhea.  Continue NovoLog 70/30 18 units twice a day.  If fasting blood sugars begin to increase above 140 with stopping the Metformin, he should increase the NovoLog 7030 to 20 units twice a day.  Continue healthy eating habits and regular exercise. -Encourage him to get an eye exam as soon as he is able to afford. -Advised patient that the diagnosis of diabetes alone does not qualify him for disability.  Currently he does not have any disabling complications from diabetes. Encouraged him to apply for the orange card/cone discount card - POCT glucose (manual entry) - POCT glycosylated hemoglobin (Hb A1C) - Microalbumin / creatinine urine ratio - Lipid panel  2. Abnormal LFTs We will recheck liver function test today.  We will also screen for hepatitis C. - Hepatic function panel  3. Influenza vaccination declined Offered.  Patient declined.  4. 23-polyvalent pneumococcal polysaccharide vaccine declined Offered.  Patient declined.  5. Screening for colon cancer Discussed colon cancer screening given his age.  We discussed methods of screening.  Patient prefers to have the fit test.  He does not want colonoscopy. - Fecal occult blood, imunochemical([facility])  6. Screening for HIV (human immunodeficiency virus) - HIV antibody (with reflex)  7. Need for hepatitis C screening test - Hepatitis C Antibody  Addendum:  Pt with positive Hep C screen.  Will ask lab to add Hep C RNA quant.   Patient was given the opportunity to ask questions.  Patient verbalized understanding of the plan and was able to repeat key elements of the plan.   Orders Placed  This Encounter  Procedures  . Fecal occult blood, imunochemical([facility])  . Microalbumin / creatinine urine ratio  . Lipid panel  . Hepatic function panel  . HIV antibody (with reflex)  . Hepatitis C Antibody  . POCT glucose (manual entry)  . POCT glycosylated hemoglobin (Hb A1C)     Requested Prescriptions    No prescriptions requested or ordered in this encounter    Return in about 4 months (around 10/16/2020).  Jonah Blue, MD, FACP

## 2020-06-17 LAB — HEPATIC FUNCTION PANEL
ALT: 31 IU/L (ref 0–44)
AST: 30 IU/L (ref 0–40)
Albumin: 4.5 g/dL (ref 3.8–4.9)
Alkaline Phosphatase: 102 IU/L (ref 44–121)
Bilirubin Total: 0.3 mg/dL (ref 0.0–1.2)
Bilirubin, Direct: 0.15 mg/dL (ref 0.00–0.40)
Total Protein: 8 g/dL (ref 6.0–8.5)

## 2020-06-17 LAB — LIPID PANEL
Chol/HDL Ratio: 4.1 ratio (ref 0.0–5.0)
Cholesterol, Total: 182 mg/dL (ref 100–199)
HDL: 44 mg/dL (ref >39)
LDL Chol Calc (NIH): 118 mg/dL — ABNORMAL HIGH (ref 0–99)
Triglycerides: 111 mg/dL (ref 0–149)
VLDL Cholesterol Cal: 20 mg/dL (ref 5–40)

## 2020-06-17 LAB — MICROALBUMIN / CREATININE URINE RATIO
Creatinine, Urine: 201.9 mg/dL
Microalb/Creat Ratio: 2 mg/g creat (ref 0–29)
Microalbumin, Urine: 4.9 ug/mL

## 2020-06-17 LAB — HEPATITIS C ANTIBODY: Hep C Virus Ab: >11 s/co ratio — ABNORMAL HIGH (ref 0.0–0.9)

## 2020-06-17 LAB — HIV ANTIBODY (ROUTINE TESTING W REFLEX): HIV Screen 4th Generation wRfx: NONREACTIVE

## 2020-06-17 LAB — FECAL OCCULT BLOOD, IMMUNOCHEMICAL: Fecal Occult Bld: NEGATIVE

## 2020-06-18 ENCOUNTER — Other Ambulatory Visit: Payer: Self-pay | Admitting: Internal Medicine

## 2020-06-18 MED ORDER — ATORVASTATIN CALCIUM 10 MG PO TABS: 10.0000 mg | ORAL_TABLET | Freq: Every day | ORAL | 3 refills | Status: DC

## 2020-06-18 NOTE — Progress Notes (Signed)
Let patient know that the screen for HIV is negative.  Screen for hepatitis C virus is positive.  I will check with our lab to see whether they still have enough blood to run the confirmatory test.  If they do not, I will let him know so that he can return to the lab to have this test done.  He has no significant amount of protein in the urine which is good.  LDL cholesterol is 118 with goal being less than 70 in people who have diabetes.  High cholesterol increases his risk for heart attack and strokes.  I recommend starting a low-dose of the medication called atorvastatin to help lower cholesterol.  Prescription sent to the pharmacy.  Healthy eating habits and regular exercise will also help to lower cholesterol.  Liver function test has normalized.  However, after being on the atorvastatin for 1 month, he should return to the lab to have liver function test rechecked.  If unable to reach this patient via phone, please sent him a letter with my comments.

## 2020-06-20 ENCOUNTER — Telehealth: Payer: Self-pay | Admitting: *Deleted

## 2020-06-20 NOTE — Telephone Encounter (Signed)
Patient verified DOB Patient is aware of labs and screenings being normal and negative except for hep c. Patient will await a phone call regarding the specimen or need for return lab draw. Patient plans to pick up cholesterol medication today and have a recheck in 1 month.

## 2020-06-20 NOTE — Telephone Encounter (Signed)
PCP added additional lab testing with LAB TECH on site

## 2020-06-20 NOTE — Addendum Note (Signed)
Addended byMemory Dance on: 06/20/2020 01:44 PM   Modules accepted: Orders

## 2020-06-20 NOTE — Telephone Encounter (Signed)
-----   Message from Marcine Matar, MD sent at 06/18/2020  5:37 PM EDT ----- Let patient know that the screen for HIV is negative.  Screen for hepatitis C virus is positive.  I will check with our lab to see whether they still have enough blood to run the confirmatory test.  If they do not, I will let him know so that he can return to the lab to have this test done.  He has no significant amount of protein in the urine which is good.  LDL cholesterol is 118 with goal being less than 70 in people who have diabetes.  High cholesterol increases his risk for heart attack and strokes.  I recommend starting a low-dose of the medication called atorvastatin to help lower cholesterol.  Prescription sent to the pharmacy.  Healthy eating habits and regular exercise will also help to lower cholesterol.  Liver function test has normalized.  However, after being on the atorvastatin for 1 month, he should return to the lab to have liver function test rechecked.  If unable to reach this patient via phone, please sent him a letter with my comments.

## 2020-06-20 NOTE — Addendum Note (Signed)
Addended by: Jonah Blue B on: 06/20/2020 12:28 PM   Modules accepted: Orders

## 2020-06-22 ENCOUNTER — Telehealth: Payer: Self-pay | Admitting: Internal Medicine

## 2020-06-22 LAB — SPECIMEN STATUS REPORT

## 2020-06-22 LAB — HCV RNA QUANT
HCV log10: 5.936 log10 IU/mL
Hepatitis C Quantitation: 863000 IU/mL

## 2020-06-22 NOTE — Telephone Encounter (Signed)
Phone call placed to patient today.  Patient informed that the confirmatory test for hepatitis C is positive.  He states that he had never been diagnosed in the past with hepatitis C.  I went over with him what is hepatitis C and how it can be contracted.  Patient denies any previous history of IVDA, blood transfusion, tattoos.  Reports that he has had multiple sexual partners in the past but is currently in a monogamous relationship.  He currently does not drink or use any street drugs.  He does not recall ever receiving vaccines for hepatitis A or B.  -Advised patient that the risks of transmitting hepatitis C to a long-term monogamous partner is less than 5% over 20-year.  However I gave that information so that he can make an informed decision about whether or not to use condom with his current girlfriend. Advised not to share things like toothbrushes and razors. -recommend that he starts the vaccine series for hepatitis A and B.  He is agreeable to doing so.  I will have them contact him with an appointment for the clinical pharmacist. -Inform patient that hepatitis C can be treated to cure.  I think he would probably be a good candidate.  He is currently in the process of applying for the orange card/cone discount.  Once approved, I request that he lets me know so that I can submit a referral for the infectious disease specialist.  Patient expressed understanding.  All questions were answered.

## 2020-06-25 ENCOUNTER — Other Ambulatory Visit (HOSPITAL_COMMUNITY): Payer: Self-pay

## 2020-06-25 ENCOUNTER — Other Ambulatory Visit: Payer: Self-pay

## 2020-06-25 MED FILL — Insulin Syringe/Needle U-100 1/2 ML 28 x 1/2": 50 days supply | Qty: 100 | Fill #0 | Status: CN

## 2020-06-26 ENCOUNTER — Other Ambulatory Visit: Payer: Self-pay

## 2020-06-27 ENCOUNTER — Ambulatory Visit: Payer: Self-pay | Admitting: Pharmacist

## 2020-06-29 ENCOUNTER — Other Ambulatory Visit: Payer: Self-pay

## 2020-06-29 ENCOUNTER — Ambulatory Visit: Payer: Self-pay | Attending: Internal Medicine | Admitting: Pharmacist

## 2020-06-29 DIAGNOSIS — Z23 Encounter for immunization: Secondary | ICD-10-CM

## 2020-06-29 MED FILL — Atorvastatin Calcium Tab 10 MG (Base Equivalent): ORAL | 30 days supply | Qty: 30 | Fill #0 | Status: AC

## 2020-06-29 MED FILL — Insulin Syringe/Needle U-100 0.3 ML 31 x 5/16": 25 days supply | Qty: 100 | Fill #0 | Status: AC

## 2020-06-29 NOTE — Patient Instructions (Addendum)
Nice meeting you today!  Today, you received your first dose of Hepatitis A and Hepatitis B vaccine.  - 2nd dose of Hepatitis A should be given 6 months  - 2nd dose of Hepatitis B should be given in 1 month  - 3rd dose of Hepatitis B should be given in 6 months (can be given at the same time as your 2nd dose of Hepatitis A)

## 2020-06-29 NOTE — Progress Notes (Signed)
Patient presents for vaccination against hepatitis A and B per orders of Dr. Laural Benes. Consent given. Counseling provided. No contraindications exists. Vaccine administered without incident.   Fabio Neighbors, PharmD, BCPS PGY2 Ambulatory Care Resident [facility]  Pharmacy

## 2020-07-04 ENCOUNTER — Other Ambulatory Visit: Payer: Self-pay

## 2020-07-27 ENCOUNTER — Other Ambulatory Visit: Payer: Self-pay

## 2020-07-27 MED FILL — Insulin Syringe/Needle U-100 0.3 ML 31 x 5/16": 25 days supply | Qty: 100 | Fill #1 | Status: AC

## 2020-07-27 MED FILL — Atorvastatin Calcium Tab 10 MG (Base Equivalent): ORAL | 30 days supply | Qty: 30 | Fill #1 | Status: AC

## 2020-07-29 ENCOUNTER — Other Ambulatory Visit: Payer: Self-pay

## 2020-07-29 ENCOUNTER — Ambulatory Visit: Payer: Self-pay | Attending: Internal Medicine | Admitting: Pharmacist

## 2020-07-29 DIAGNOSIS — Z23 Encounter for immunization: Secondary | ICD-10-CM

## 2020-07-29 NOTE — Progress Notes (Signed)
Patient presents for vaccination against Hep B per orders of Dr. Laural Benes. Consent given. Counseling provided. No contraindications exists. Vaccine administered without incident.   This is his second Hep B vaccination. He should return in October for his 3rd and final HepB vaccination. We will also administer his 2nd HepA vaccine at this appointment.   Butch Penny, PharmD, Patsy [place], CPP Clinical Pharmacist [facility] [phone]

## 2020-09-12 ENCOUNTER — Other Ambulatory Visit: Payer: Self-pay | Admitting: Internal Medicine

## 2020-09-12 ENCOUNTER — Other Ambulatory Visit: Payer: Self-pay

## 2020-09-12 DIAGNOSIS — E1169 Type 2 diabetes mellitus with other specified complication: Secondary | ICD-10-CM

## 2020-09-12 MED ORDER — INSULIN ASPART PROT & ASPART (70-30 MIX) 100 UNIT/ML ~~LOC~~ SUSP
18.0000 [IU] | Freq: Two times a day (BID) | SUBCUTANEOUS | 1 refills | Status: DC
  Filled 2020-09-12: qty 10, 28d supply, fill #0
  Filled 2020-09-20: qty 20, 56d supply, fill #0

## 2020-09-12 MED FILL — Insulin Syringe/Needle U-100 1/2 ML 31 x 5/16": 30 days supply | Qty: 100 | Fill #0 | Status: CN

## 2020-09-19 ENCOUNTER — Other Ambulatory Visit: Payer: Self-pay

## 2020-09-20 ENCOUNTER — Other Ambulatory Visit: Payer: Self-pay

## 2020-09-20 MED FILL — Insulin Syringe/Needle U-100 1/2 ML 31 x 5/16": 50 days supply | Qty: 100 | Fill #0 | Status: AC

## 2020-09-20 MED FILL — Atorvastatin Calcium Tab 10 MG (Base Equivalent): ORAL | 30 days supply | Qty: 30 | Fill #2 | Status: AC

## 2020-10-18 ENCOUNTER — Other Ambulatory Visit: Payer: Self-pay

## 2020-10-20 ENCOUNTER — Ambulatory Visit: Payer: Self-pay | Admitting: Internal Medicine

## 2020-10-28 ENCOUNTER — Other Ambulatory Visit: Payer: Self-pay | Admitting: Internal Medicine

## 2020-10-28 ENCOUNTER — Other Ambulatory Visit: Payer: Self-pay

## 2020-10-28 DIAGNOSIS — E669 Obesity, unspecified: Secondary | ICD-10-CM

## 2020-10-28 DIAGNOSIS — E1169 Type 2 diabetes mellitus with other specified complication: Secondary | ICD-10-CM

## 2020-10-28 MED ORDER — INSULIN ASPART PROT & ASPART (70-30 MIX) 100 UNIT/ML ~~LOC~~ SUSP
18.0000 [IU] | Freq: Two times a day (BID) | SUBCUTANEOUS | 1 refills | Status: DC
  Filled 2020-10-28: qty 10, 28d supply, fill #0
  Filled 2020-11-15: qty 20, 56d supply, fill #0

## 2020-10-28 MED ORDER — "INSULIN SYRINGE-NEEDLE U-100 31G X 5/16"" 0.5 ML MISC"
2 refills | Status: DC
  Filled 2020-10-28: qty 100, 50d supply, fill #0
  Filled 2021-01-06: qty 100, 50d supply, fill #1
  Filled 2021-03-06: qty 100, 50d supply, fill #2
  Filled 2021-04-13: qty 100, 50d supply, fill #0

## 2020-10-28 NOTE — Telephone Encounter (Signed)
Medication: Rx #: [id number] insulin aspart protamine- aspart (NOVOLOG MIX 70/30) (70-30) 100 UNIT/ML injection [[id number]] , Rx #: [id number]  Insulin Syringe-Needle U-100 31G X 5/16" 0.5 ML MISC [[id number]]     Has the patient contacted their pharmacy? YES (Agent: If no, request that the patient contact the pharmacy for the refill.) (Agent: If yes, when and what did the pharmacy advise?)  Preferred Pharmacy (with phone number or street name): [facility] [address] [place] [address] Phone: [phone] Fax: [phone] Hours: M-F 8:30a-5:30p    Agent: Please be advised that RX refills may take up to 3 business days. We ask that you follow-up with your pharmacy.

## 2020-10-31 ENCOUNTER — Other Ambulatory Visit: Payer: Self-pay

## 2020-11-01 ENCOUNTER — Other Ambulatory Visit: Payer: Self-pay

## 2020-11-15 ENCOUNTER — Other Ambulatory Visit: Payer: Self-pay

## 2020-11-16 ENCOUNTER — Other Ambulatory Visit: Payer: Self-pay

## 2020-12-09 ENCOUNTER — Ambulatory Visit: Payer: Self-pay | Admitting: Internal Medicine

## 2020-12-23 ENCOUNTER — Other Ambulatory Visit: Payer: Self-pay | Admitting: Internal Medicine

## 2020-12-23 ENCOUNTER — Other Ambulatory Visit: Payer: Self-pay

## 2020-12-23 DIAGNOSIS — E1169 Type 2 diabetes mellitus with other specified complication: Secondary | ICD-10-CM

## 2020-12-23 MED ORDER — INSULIN ASPART PROT & ASPART (70-30 MIX) 100 UNIT/ML ~~LOC~~ SUSP
18.0000 [IU] | Freq: Two times a day (BID) | SUBCUTANEOUS | 1 refills | Status: DC
  Filled 2020-12-23 – 2021-01-06 (×2): qty 10, 28d supply, fill #0
  Filled 2021-01-30: qty 10, 28d supply, fill #1

## 2020-12-29 ENCOUNTER — Ambulatory Visit: Payer: Self-pay | Attending: Internal Medicine | Admitting: Pharmacist

## 2020-12-29 ENCOUNTER — Other Ambulatory Visit: Payer: Self-pay

## 2020-12-29 DIAGNOSIS — Z23 Encounter for immunization: Secondary | ICD-10-CM

## 2020-12-29 NOTE — Progress Notes (Signed)
Patient presents for vaccination against HepA/HepB per orders of Dr. Laural Benes. Consent given. Counseling provided. No contraindications exists. Vaccine administered without incident.   This completes his series for both vaccines.   Butch Penny, PharmD, Patsy [place], CPP Clinical Pharmacist [facility] [phone]

## 2021-01-04 ENCOUNTER — Other Ambulatory Visit: Payer: Self-pay

## 2021-01-04 ENCOUNTER — Encounter (HOSPITAL_BASED_OUTPATIENT_CLINIC_OR_DEPARTMENT_OTHER): Payer: Self-pay

## 2021-01-04 ENCOUNTER — Emergency Department (HOSPITAL_BASED_OUTPATIENT_CLINIC_OR_DEPARTMENT_OTHER)
Admission: EM | Admit: 2021-01-04 | Discharge: 2021-01-04 | Disposition: A | Discharge status: Home / Self Care | Payer: Self-pay | Attending: Emergency Medicine | Admitting: Emergency Medicine

## 2021-01-04 DIAGNOSIS — H6692 Otitis media, unspecified, left ear: Secondary | ICD-10-CM | POA: Insufficient documentation

## 2021-01-04 DIAGNOSIS — Z7984 Long term (current) use of oral hypoglycemic drugs: Secondary | ICD-10-CM | POA: Insufficient documentation

## 2021-01-04 DIAGNOSIS — R42 Dizziness and giddiness: Secondary | ICD-10-CM | POA: Insufficient documentation

## 2021-01-04 DIAGNOSIS — H6690 Otitis media, unspecified, unspecified ear: Secondary | ICD-10-CM

## 2021-01-04 DIAGNOSIS — E1169 Type 2 diabetes mellitus with other specified complication: Secondary | ICD-10-CM | POA: Insufficient documentation

## 2021-01-04 DIAGNOSIS — Z794 Long term (current) use of insulin: Secondary | ICD-10-CM | POA: Insufficient documentation

## 2021-01-04 HISTORY — DX: Type 2 diabetes mellitus without complications: E11.9

## 2021-01-04 MED ORDER — AMOXICILLIN 500 MG PO CAPS
500.0000 mg | ORAL_CAPSULE | Freq: Three times a day (TID) | ORAL | 0 refills | Status: DC
Start: 2021-01-04 — End: 2021-05-17

## 2021-01-04 MED ORDER — AMOXICILLIN 500 MG PO CAPS
500.0000 mg | ORAL_CAPSULE | Freq: Once | ORAL | Status: AC
  Administered 2021-01-04: 500 mg via ORAL
  Filled 2021-01-04: qty 1

## 2021-01-04 NOTE — ED Provider Notes (Signed)
[facility] EMERGENCY DEPARTMENT Provider Note   CSN: [id number] Arrival date & time: 01/04/21  2117     History Chief Complaint  Patient presents with   Otalgia    Tyrone Jennings is a 50 y.o. male.   Otalgia Associated symptoms: no abdominal pain and no fever   Patient presents with left ear pain.  Has had for the last 3 days.  States he has some dizziness with it.  States he has had previous ear infections.  He is diabetic.  States that sugars been doing very well at home.  Between 80 and about 100.  No fevers.  Pain with moving the ear.  Pain is touching that side of his head.  No fevers or chills.    Past Medical History:  Diagnosis Date   Carpal tunnel syndrome    Diabetes mellitus without complication [facility])     Patient Active Problem List   Diagnosis Date Noted   Establishing care with new doctor, encounter for 04/11/2020   Type 2 diabetes mellitus with obesity (HCC) 04/11/2020   Abnormal LFTs 04/11/2020   Hyperosmolar hyperglycemic state (HHS) (HCC) 03/09/2020    History reviewed. No pertinent surgical history.     Family History  Problem Relation Age of Onset   Diabetes Other     Social History   Tobacco Use   Smoking status: Never   Smokeless tobacco: Never  Vaping Use   Vaping Use: Never used  Substance Use Topics   Alcohol use: No   Drug use: No    Home Medications Prior to Admission medications   Medication Sig Start Date End Date Taking? Authorizing Provider  amoxicillin (AMOXIL) 500 MG capsule Take 1 capsule (500 mg total) by mouth 3 (three) times daily. 01/04/21  Yes Benjiman Core, MD  atorvastatin (LIPITOR) 10 MG tablet TAKE 1 TABLET (10 MG TOTAL) BY MOUTH DAILY. 06/18/20 06/18/21  Marcine Matar, MD  insulin aspart protamine- aspart (NOVOLOG MIX 70/30) (70-30) 100 UNIT/ML injection Inject 0.18 mLs (18 Units total) into the skin 2 (two) times daily with a meal. 12/23/20   Marcine Matar, MD  Insulin Syringe-Needle U-100  31G X 5/16" 0.3 ML MISC USE AS DIRECTED 03/11/20 03/11/21  Pokhrel, Rebekah Chesterfield, MD  Insulin Syringe-Needle U-100 31G X 5/16" 0.5 ML MISC Use as directed 10/28/20 10/28/21  Marcine Matar, MD  metFORMIN (GLUCOPHAGE) 500 MG tablet Take 1 tablet (500 mg total) by mouth daily with breakfast. 04/11/20 06/16/20  Marcine Matar, MD    Allergies    Metformin and related and Other  Review of Systems   Review of Systems  Constitutional:  Negative for appetite change and fever.  HENT:  Positive for ear pain.   Respiratory:  Negative for shortness of breath.   Cardiovascular:  Negative for chest pain.  Gastrointestinal:  Negative for abdominal pain.  Musculoskeletal:  Negative for back pain.  Neurological:  Positive for dizziness. Negative for weakness.   Physical Exam Updated Vital Signs BP 137/88 (BP Location: Left Arm)   Pulse 65   Temp 98.3 F (36.8 C) (Oral)   Resp 18   Ht 5\' 6"  (1.676 m)   Wt 102.1 kg   SpO2 95%   BMI 36.32 kg/m   Physical Exam Vitals and nursing note reviewed.  HENT:     Head: Atraumatic.     Right Ear: Tympanic membrane normal.     Ears:     Comments: Some bulging and erythema of left TM.  Some tenderness with moving of ear.  No skin changes.  No swelling over mastoid.  Some tenderness anteriorly over TMJ also. Eyes:     Extraocular Movements: Extraocular movements intact.  Cardiovascular:     Rate and Rhythm: Regular rhythm.  Musculoskeletal:        General: No tenderness.     Cervical back: Neck supple.  Skin:    General: Skin is warm.  Neurological:     Mental Status: He is alert.    ED Results / Procedures / Treatments   Labs (all labs ordered are listed, but only abnormal results are displayed) Labs Reviewed - No data to display  EKG None  Radiology No results found.  Procedures Procedures   Medications Ordered in ED Medications  amoxicillin (AMOXIL) capsule 500 mg (500 mg Oral Given 01/04/21 2232)    ED Course  I have reviewed  the triage vital signs and the nursing notes.  Pertinent labs & imaging results that were available during my care of the patient were reviewed by me and considered in my medical decision making (see chart for details).    MDM Rules/Calculators/A&P                          Patient with pain in the left ear.  History of otitis media.  Does have some small effusion and some bulging of the eardrum.  Also some erythema.  Pain in that area.  Doubt mastoiditis.  Will treat with antibiotics.  Sugars have been doing well.  Will discharge home with antibiotics Final Clinical Impression(s) / ED Diagnoses Final diagnoses:  Acute otitis media, unspecified otitis media type    Rx / DC Orders ED Discharge Orders          Ordered    amoxicillin (AMOXIL) 500 MG capsule  3 times daily        01/04/21 2223             Benjiman Core, MD 01/04/21 2236

## 2021-01-04 NOTE — ED Triage Notes (Signed)
Pt c/o left earache x 3 days-NAD-steady gait

## 2021-01-06 ENCOUNTER — Other Ambulatory Visit: Payer: Self-pay

## 2021-01-06 MED FILL — Atorvastatin Calcium Tab 10 MG (Base Equivalent): ORAL | 30 days supply | Qty: 30 | Fill #3 | Status: AC

## 2021-01-09 ENCOUNTER — Other Ambulatory Visit: Payer: Self-pay

## 2021-01-10 ENCOUNTER — Other Ambulatory Visit: Payer: Self-pay

## 2021-01-30 ENCOUNTER — Other Ambulatory Visit: Payer: Self-pay

## 2021-02-01 ENCOUNTER — Other Ambulatory Visit: Payer: Self-pay

## 2021-03-06 ENCOUNTER — Other Ambulatory Visit: Payer: Self-pay | Admitting: Internal Medicine

## 2021-03-06 ENCOUNTER — Other Ambulatory Visit: Payer: Self-pay

## 2021-03-06 DIAGNOSIS — E669 Obesity, unspecified: Secondary | ICD-10-CM

## 2021-03-06 DIAGNOSIS — E1169 Type 2 diabetes mellitus with other specified complication: Secondary | ICD-10-CM

## 2021-03-06 MED ORDER — INSULIN ASPART PROT & ASPART (70-30 MIX) 100 UNIT/ML ~~LOC~~ SUSP
18.0000 [IU] | Freq: Two times a day (BID) | SUBCUTANEOUS | 1 refills | Status: DC
  Filled 2021-03-06 – 2021-04-13 (×2): qty 10, 28d supply, fill #0

## 2021-03-06 NOTE — Telephone Encounter (Signed)
Requested Prescriptions  Pending Prescriptions Disp Refills  . insulin aspart protamine- aspart (NOVOLOG MIX 70/30) (70-30) 100 UNIT/ML injection 10 mL 1    Sig: Inject 0.18 mLs (18 Units total) into the skin 2 (two) times daily with a meal.     Endocrinology:  Diabetes - Insulins Failed - 03/06/2021  9:30 AM      Failed - HBA1C is between 0 and 7.9 and within 180 days    HbA1c, POC (controlled diabetic range)  Date Value Ref Range Status  06/16/2020 5.9 0.0 - 7.0 % Final         Passed - Valid encounter within last 6 months    Recent Outpatient Visits          2 months ago Need for hepatitis B vaccination   [facility] [place], Cornelius Moras, RPH-CPP   7 months ago Need for hepatitis B vaccination   [facility] [place], Cornelius Moras, RPH-CPP   8 months ago Need for hepatitis A vaccination   [facility] [place], Cornelius Moras, RPH-CPP   8 months ago Type 2 diabetes mellitus with obesity [facility])   [place] [facility] Marcine Matar, MD   10 months ago Establishing care with new doctor, encounter for   [facility] Marcine Matar, MD

## 2021-03-09 ENCOUNTER — Other Ambulatory Visit: Payer: Self-pay

## 2021-04-13 ENCOUNTER — Other Ambulatory Visit: Payer: Self-pay

## 2021-04-18 ENCOUNTER — Other Ambulatory Visit: Payer: Self-pay | Admitting: Internal Medicine

## 2021-04-18 ENCOUNTER — Other Ambulatory Visit: Payer: Self-pay

## 2021-04-18 MED ORDER — ATORVASTATIN CALCIUM 10 MG PO TABS
ORAL_TABLET | Freq: Every day | ORAL | 1 refills | Status: DC
  Filled 2021-04-18: qty 30, 30d supply, fill #0

## 2021-04-18 NOTE — Telephone Encounter (Signed)
Requested Prescriptions  Pending Prescriptions Disp Refills   atorvastatin (LIPITOR) 10 MG tablet 30 tablet 1    Sig: TAKE 1 TABLET (10 MG TOTAL) BY MOUTH DAILY.     Cardiovascular:  Antilipid - Statins Failed - 04/18/2021 12:03 PM      Failed - LDL in normal range and within 360 days    LDL Chol Calc (NIH)  Date Value Ref Range Status  06/16/2020 118 (H) 0 - 99 mg/dL Final         Passed - Total Cholesterol in normal range and within 360 days    Cholesterol, Total  Date Value Ref Range Status  06/16/2020 182 100 - 199 mg/dL Final         Passed - HDL in normal range and within 360 days    HDL  Date Value Ref Range Status  06/16/2020 44 >39 mg/dL Final         Passed - Triglycerides in normal range and within 360 days    Triglycerides  Date Value Ref Range Status  06/16/2020 111 0 - 149 mg/dL Final         Passed - Patient is not pregnant      Passed - Valid encounter within last 12 months    Recent Outpatient Visits          3 months ago Need for hepatitis B vaccination   [place], Jarome Matin, RPH-CPP   8 months ago Need for hepatitis B vaccination   [place], Jarome Matin, RPH-CPP   9 months ago Need for hepatitis A vaccination   [place], Annie Main L, RPH-CPP   10 months ago Type 2 diabetes mellitus with obesity [facility])   [facility] Ladell Pier, MD   1 year ago Establishing care with new doctor, encounter for   [place] Ladell Pier, MD

## 2021-04-25 ENCOUNTER — Other Ambulatory Visit: Payer: Self-pay

## 2021-05-17 ENCOUNTER — Other Ambulatory Visit: Payer: Self-pay | Admitting: Internal Medicine

## 2021-05-17 ENCOUNTER — Other Ambulatory Visit: Payer: Self-pay

## 2021-05-17 ENCOUNTER — Ambulatory Visit: Payer: Self-pay | Attending: Physician Assistant | Admitting: Physician Assistant

## 2021-05-17 ENCOUNTER — Ambulatory Visit: Payer: Self-pay | Admitting: *Deleted

## 2021-05-17 ENCOUNTER — Encounter: Payer: Self-pay | Admitting: Physician Assistant

## 2021-05-17 VITALS — BP 121/83 | HR 73 | Wt 212.8 lb

## 2021-05-17 DIAGNOSIS — Z794 Long term (current) use of insulin: Secondary | ICD-10-CM

## 2021-05-17 DIAGNOSIS — E7800 Pure hypercholesterolemia, unspecified: Secondary | ICD-10-CM

## 2021-05-17 DIAGNOSIS — E1169 Type 2 diabetes mellitus with other specified complication: Secondary | ICD-10-CM

## 2021-05-17 DIAGNOSIS — E669 Obesity, unspecified: Secondary | ICD-10-CM

## 2021-05-17 DIAGNOSIS — Z7984 Long term (current) use of oral hypoglycemic drugs: Secondary | ICD-10-CM

## 2021-05-17 DIAGNOSIS — Z6834 Body mass index (BMI) 34.0-34.9, adult: Secondary | ICD-10-CM

## 2021-05-17 LAB — GLUCOSE, POCT (MANUAL RESULT ENTRY): POC Glucose: 82 mg/dl (ref 70–99)

## 2021-05-17 LAB — POCT GLYCOSYLATED HEMOGLOBIN (HGB A1C): HbA1c, POC (controlled diabetic range): 11.5 % — AB (ref 0.0–7.0)

## 2021-05-17 MED ORDER — TRUE METRIX BLOOD GLUCOSE TEST VI STRP
ORAL_STRIP | 12 refills | Status: AC
  Filled 2021-05-17: qty 100, 30d supply, fill #0
  Filled 2021-10-25: qty 100, 30d supply, fill #1
  Filled 2021-11-30: qty 100, 30d supply, fill #2
  Filled 2022-01-16: qty 100, 30d supply, fill #3
  Filled 2022-03-12: qty 100, 30d supply, fill #4

## 2021-05-17 MED ORDER — TRUEPLUS LANCETS 28G MISC
1.0000 | Freq: Three times a day (TID) | 3 refills | Status: DC
  Filled 2021-05-17: qty 100, 33d supply, fill #0
  Filled 2021-10-25: qty 100, 33d supply, fill #1
  Filled 2021-11-30: qty 100, 33d supply, fill #2
  Filled 2022-01-16: qty 100, 33d supply, fill #3

## 2021-05-17 MED ORDER — GLIPIZIDE 5 MG PO TABS
5.0000 mg | ORAL_TABLET | Freq: Two times a day (BID) | ORAL | 3 refills | Status: DC
  Filled 2021-05-17: qty 60, 30d supply, fill #0
  Filled 2021-08-14: qty 60, 30d supply, fill #1
  Filled 2021-09-18 – 2021-10-25 (×3): qty 60, 30d supply, fill #2
  Filled 2021-11-30: qty 60, 30d supply, fill #3

## 2021-05-17 MED ORDER — INSULIN ASPART PROT & ASPART (70-30 MIX) 100 UNIT/ML ~~LOC~~ SUSP
20.0000 [IU] | Freq: Two times a day (BID) | SUBCUTANEOUS | 2 refills | Status: DC
  Filled 2021-05-17: qty 10, 25d supply, fill #0
  Filled 2021-07-05: qty 10, 25d supply, fill #1
  Filled 2021-08-14: qty 10, 25d supply, fill #2

## 2021-05-17 MED ORDER — "INSULIN SYRINGE-NEEDLE U-100 31G X 5/16"" 0.5 ML MISC"
2 refills | Status: AC
  Filled 2021-05-17: qty 180, fill #0
  Filled 2021-07-05: qty 100, 50d supply, fill #0
  Filled 2021-08-14: qty 100, 50d supply, fill #1
  Filled 2021-10-02 – 2021-10-25 (×2): qty 100, 50d supply, fill #2
  Filled 2021-11-30 – 2022-01-16 (×2): qty 100, 50d supply, fill #3
  Filled 2022-03-06: qty 100, 50d supply, fill #4
  Filled 2022-04-30: qty 100, 50d supply, fill #5

## 2021-05-17 MED ORDER — TRUE METRIX METER W/DEVICE KIT
1.0000 | PACK | Freq: Three times a day (TID) | 0 refills | Status: AC
  Filled 2021-05-17: qty 1, 30d supply, fill #0

## 2021-05-17 NOTE — Progress Notes (Signed)
Patient ID: Tyrone Jennings, male   DOB: 1970-12-06, 51 y.o.   MRN: [id number]   Tyrone Jennings, is a 51 y.o. male  PXT:[id number]  OEV:[id number]  DOB - [birth year]  Chief Complaint  Patient presents with   Diabetes       Subjective:   Tyrone Jennings is a 51 y.o. male here today for med RF.  He says he is 100% compliant with med regimen of 70/30 18 units bid.  He could not tolerate metformin due to diarrhea.  He also thinks he had diarrhea with cholesterol med so he has not been taking it.  No other issues or concerns.  He says his meter only works some of the time, so he would like me to send him another one.  He says his niece cooks for him and that he doesn't eat a lot of carbs or sugar.    No problems updated.  ALLERGIES: Allergies  Allergen Reactions   Metformin And Related Diarrhea   Other Other (See Comments)    Clorox. "flares me up."    PAST MEDICAL HISTORY: Past Medical History:  Diagnosis Date   Carpal tunnel syndrome    Diabetes mellitus without complication ([place])     MEDICATIONS AT HOME: Prior to Admission medications   Medication Sig Start Date End Date Taking? Authorizing Provider  Blood Glucose Monitoring Suppl (TRUE METRIX METER) w/Device KIT 1 each by Does not apply route 3 (three) times daily. 05/17/21  Yes Freeman Caldron M, PA-C  glipiZIDE (GLUCOTROL) 5 MG tablet Take 1 tablet (5 mg total) by mouth 2 (two) times daily before a meal. 05/17/21  Yes ,  M, PA-C  glucose blood (TRUE METRIX BLOOD GLUCOSE TEST) test strip Use as instructed 05/17/21  Yes ,  M, PA-C  TRUEplus Lancets 28G MISC 1 each by Does not apply route 3 (three) times daily. 05/17/21  Yes ,  M, PA-C  atorvastatin (LIPITOR) 10 MG tablet TAKE 1 TABLET (10 MG TOTAL) BY MOUTH DAILY. 04/18/21 04/18/22  Ladell Pier, MD  insulin aspart protamine- aspart (NOVOLOG MIX 70/30) (70-30) 100 UNIT/ML injection Inject 0.2 mLs (20 Units total) into the skin 2 (two) times daily with  a meal. 05/17/21   , Dionne Bucy, PA-C  Insulin Syringe-Needle U-100 31G X 5/16" 0.5 ML MISC Use as directed 05/17/21 05/17/22  Argentina Donovan, PA-C  metFORMIN (GLUCOPHAGE) 500 MG tablet Take 1 tablet (500 mg total) by mouth daily with breakfast. 04/11/20 06/16/20  Ladell Pier, MD    ROS: Neg HEENT Neg resp Neg cardiac Neg GI Neg GU Neg MS Neg psych Neg neuro  Objective:   Vitals:   05/17/21 1342  BP: 121/83  Pulse: 73  SpO2: 96%  Weight: 212 lb 12.8 oz (96.5 kg)   Exam General appearance : Awake, alert, not in any distress. Speech Clear. Not toxic looking HEENT: Atraumatic and Normocephalic Neck: Supple, no JVD. No cervical lymphadenopathy.  Chest: Good air entry bilaterally, CTAB.  No rales/rhonchi/wheezing CVS: S1 S2 regular, no murmurs.  Extremities: B/L Lower Ext shows no edema, both legs are warm to touch Neurology: Awake alert, and oriented X 3, CN II-XII intact, Non focal Skin: No Rash  Data Review Lab Results  Component Value Date   HGBA1C 11.5 (A) 05/17/2021   HGBA1C 5.9 06/16/2020   HGBA1C 10.5 (H) 03/09/2020    Assessment & Plan   1. Type 2 diabetes mellitus with obesity (HCC) Blood sugar is in 80s today but home readings 200-300  and always high.  Check blood sugar fasting and AC.  Readings do not seem congruent based on him saying he is compliant with diet and meds 100%-I will increase his 70/30 to bid with food and add glipizide bid - Glucose (CBG) - HgB A1c - Comprehensive metabolic panel - CBC with Differential/Platelet - insulin aspart protamine- aspart (NOVOLOG MIX 70/30) (70-30) 100 UNIT/ML injection; Inject 0.2 mLs (20 Units total) into the skin 2 (two) times daily with a meal.  Dispense: 10 mL; Refill: 2 - Insulin Syringe-Needle U-100 31G X 5/16" 0.5 ML MISC; Use as directed  Dispense: 180 each; Refill: 2 - glipiZIDE (GLUCOTROL) 5 MG tablet; Take 1 tablet (5 mg total) by mouth 2 (two) times daily before a meal.  Dispense: 60 tablet;  Refill: 3 - Blood Glucose Monitoring Suppl (TRUE METRIX METER) w/Device KIT; 1 each by Does not apply route 3 (three) times daily.  Dispense: 1 kit; Refill: 0 - glucose blood (TRUE METRIX BLOOD GLUCOSE TEST) test strip; Use as instructed  Dispense: 100 each; Refill: 12 - TRUEplus Lancets 28G MISC; 1 each by Does not apply route 3 (three) times daily.  Dispense: 100 each; Refill: 3  2. High cholesterol - Lipid panel -he is willing to try atorvastatin again    Patient have been counseled extensively about nutrition and exercise. Other issues discussed during this visit include: low cholesterol diet, weight control and daily exercise, foot care, annual eye examinations at Ophthalmology, importance of adherence with medications and regular follow-up. We also discussed long term complications of uncontrolled diabetes and hypertension.   Return for 3 weeks with Lurena Joiner for DM;  3 months with Dr Wynetta Emery.  The patient was given clear instructions to go to ER or return to medical center if symptoms don't improve, worsen or new problems develop. The patient verbalized understanding. The patient was told to call to get lab results if they haven't heard anything in the next week.      Freeman Caldron, PA-C [facility] [place], [place]   05/17/2021, 1:54 PM

## 2021-05-17 NOTE — Telephone Encounter (Signed)
°  Chief Complaint: elevated glucose Symptoms: waking up sweating, elevated glucose reading Frequency: patient report high readings on/off for several days now Pertinent Negatives: Patient denies missed medication Disposition: [] ED /[] Urgent Care (no appt availability in office) / [x] Appointment(In office/virtual)/ []  [place] [facility]/ [] Home Care/ [] Refused Recommended Disposition /[] [place] [facility]/ []  Follow-up with PCP Additional Notes: Computer closed during triage- patient reports elevated glucose in insulin. He is concerned that his levels are extremely high fasting and after lunch. Patient advised bring records and machine to appointment- may need to have medication adjusted  Summary: DB concerns / High sugar   The patient shares that their blood sugar was 3090 today at 11 AM approx   The patient has been waking up in sweats for the past three days   The patient would like to speak with a member of staff when possible   Please contact further      Answer Assessment - Initial Assessment Questions 1. BLOOD GLUCOSE: "What is your blood glucose level?"      303- 11:00    449- 6:30 2. ONSET: "When did you check the blood glucose?"     Patient is checking his glucose daily 3. USUAL RANGE: "What is your glucose level usually?" (e.g., usual fasting morning value, usual evening value)     High fasting and lunch 4. KETONES: "Do you check for ketones (urine or blood test strips)?" If yes, ask: "What does the test show now?"      *No Answer* 5. TYPE 1 or 2:  "Do you know what type of diabetes you have?"  (e.g., Type 1, Type 2, Gestational; doesn't know)      *No Answer* 6. INSULIN: "Do you take insulin?" "What type of insulin(s) do you use? What is the mode of delivery? (syringe, pen; injection or pump)?"      *No Answer* 7. DIABETES PILLS: "Do you take any pills for your diabetes?" If yes, ask: "Have you missed taking any pills recently?"     *No Answer* 8. OTHER SYMPTOMS:  "Do you have any symptoms?" (e.g., fever, frequent urination, difficulty breathing, dizziness, weakness, vomiting)     *No Answer* 9. PREGNANCY: "Is there any chance you are pregnant?" "When was your last menstrual period?"     *No Answer*  Protocols used: Diabetes - High Blood Sugar-A-AH

## 2021-05-17 NOTE — Telephone Encounter (Deleted)
Reason for Disposition  [1] Blood glucose > 300 mg/dL (76.5 mmol/L) AND [4] two or more times in a row  Answer Assessment - Initial Assessment Questions 1. BLOOD GLUCOSE: "What is your blood glucose level?"      *** 2. ONSET: "When did you check the blood glucose?"     *** 3. USUAL RANGE: "What is your glucose level usually?" (e.g., usual fasting morning value, usual evening value)     *** 4. KETONES: "Do you check for ketones (urine or blood test strips)?" If yes, ask: "What does the test show now?"      *** 5. TYPE 1 or 2:  "Do you know what type of diabetes you have?"  (e.g., Type 1, Type 2, Gestational; doesn't know)      *** 6. INSULIN: "Do you take insulin?" "What type of insulin(s) do you use? What is the mode of delivery? (syringe, pen; injection or pump)?"      *** 7. DIABETES PILLS: "Do you take any pills for your diabetes?" If yes, ask: "Have you missed taking any pills recently?"     *** 8. OTHER SYMPTOMS: "Do you have any symptoms?" (e.g., fever, frequent urination, difficulty breathing, dizziness, weakness, vomiting)     *** 9. PREGNANCY: "Is there any chance you are pregnant?" "When was your last menstrual period?"     ***  Protocols used: Diabetes - High Blood Sugar-A-AH

## 2021-05-18 ENCOUNTER — Other Ambulatory Visit: Payer: Self-pay

## 2021-05-18 ENCOUNTER — Other Ambulatory Visit: Payer: Self-pay | Admitting: Physician Assistant

## 2021-05-18 LAB — COMPREHENSIVE METABOLIC PANEL
ALT: 25 IU/L (ref 0–44)
AST: 26 IU/L (ref 0–40)
Albumin/Globulin Ratio: 1.4 (ref 1.2–2.2)
Albumin: 5 g/dL — ABNORMAL HIGH (ref 3.8–4.9)
Alkaline Phosphatase: 126 IU/L — ABNORMAL HIGH (ref 44–121)
BUN/Creatinine Ratio: 8 — ABNORMAL LOW (ref 9–20)
BUN: 9 mg/dL (ref 6–24)
Bilirubin Total: 0.6 mg/dL (ref 0.0–1.2)
CO2: 25 mmol/L (ref 20–29)
Calcium: 10.4 mg/dL — ABNORMAL HIGH (ref 8.7–10.2)
Chloride: 100 mmol/L (ref 96–106)
Creatinine, Ser: 1.07 mg/dL (ref 0.76–1.27)
Globulin, Total: 3.5 g/dL (ref 1.5–4.5)
Glucose: 87 mg/dL (ref 70–99)
Potassium: 4 mmol/L (ref 3.5–5.2)
Sodium: 139 mmol/L (ref 134–144)
Total Protein: 8.5 g/dL (ref 6.0–8.5)
eGFR: 83 mL/min/{1.73_m2} (ref >59)

## 2021-05-18 LAB — CBC WITH DIFFERENTIAL/PLATELET
Basophils Absolute: 0 10*3/uL (ref 0.0–0.2)
Basos: 1 %
EOS (ABSOLUTE): 0.1 10*3/uL (ref 0.0–0.4)
Eos: 2 %
Hematocrit: 44.4 % (ref 37.5–51.0)
Hemoglobin: 15.2 g/dL (ref 13.0–17.7)
Immature Grans (Abs): 0 10*3/uL (ref 0.0–0.1)
Immature Granulocytes: 0 %
Lymphocytes Absolute: 2.7 10*3/uL (ref 0.7–3.1)
Lymphs: 53 %
MCH: 29.3 pg (ref 26.6–33.0)
MCHC: 34.2 g/dL (ref 31.5–35.7)
MCV: 86 fL (ref 79–97)
Monocytes Absolute: 0.5 10*3/uL (ref 0.1–0.9)
Monocytes: 11 %
Neutrophils Absolute: 1.7 10*3/uL (ref 1.4–7.0)
Neutrophils: 33 %
Platelets: 190 10*3/uL (ref 150–450)
RBC: 5.19 x10E6/uL (ref 4.14–5.80)
RDW: 13 % (ref 11.6–15.4)
WBC: 5 10*3/uL (ref 3.4–10.8)

## 2021-05-18 LAB — LIPID PANEL
Chol/HDL Ratio: 4.9 ratio (ref 0.0–5.0)
Cholesterol, Total: 194 mg/dL (ref 100–199)
HDL: 40 mg/dL (ref >39)
LDL Chol Calc (NIH): 131 mg/dL — ABNORMAL HIGH (ref 0–99)
Triglycerides: 128 mg/dL (ref 0–149)
VLDL Cholesterol Cal: 23 mg/dL (ref 5–40)

## 2021-05-18 MED ORDER — ATORVASTATIN CALCIUM 10 MG PO TABS
ORAL_TABLET | Freq: Every day | ORAL | 1 refills | Status: DC
  Filled 2021-05-18: qty 30, 30d supply, fill #0
  Filled 2021-07-05: qty 90, 90d supply, fill #0
  Filled 2021-10-25: qty 90, 90d supply, fill #1

## 2021-05-25 ENCOUNTER — Other Ambulatory Visit: Payer: Self-pay

## 2021-06-05 ENCOUNTER — Telehealth: Payer: Self-pay | Admitting: Internal Medicine

## 2021-06-05 NOTE — Telephone Encounter (Signed)
Pt is calling to reschedule his appt with [facility]. Attempted to reach the office, there was no answer. ? ?CB-  [phone] ?

## 2021-06-06 NOTE — Telephone Encounter (Unsigned)
Patient appointment with Franky Macho was scheduled on 05/17/2021 for 06/12/2021.  ?Appt rescheduled for  April 4 at 1530.  ?

## 2021-06-12 ENCOUNTER — Ambulatory Visit: Payer: Self-pay | Admitting: Pharmacist

## 2021-06-27 ENCOUNTER — Ambulatory Visit: Payer: Self-pay | Admitting: Pharmacist

## 2021-07-05 ENCOUNTER — Other Ambulatory Visit: Payer: Self-pay

## 2021-08-14 ENCOUNTER — Other Ambulatory Visit: Payer: Self-pay

## 2021-08-14 ENCOUNTER — Telehealth: Payer: Self-pay

## 2021-08-14 NOTE — Telephone Encounter (Signed)
Talked with pharmacy and pt. He will be able to get his medication for trip. Called pt and he is aware

## 2021-08-16 ENCOUNTER — Other Ambulatory Visit: Payer: Self-pay

## 2021-08-22 ENCOUNTER — Ambulatory Visit: Payer: Self-pay | Admitting: Internal Medicine

## 2021-09-18 ENCOUNTER — Other Ambulatory Visit: Payer: Self-pay | Admitting: Physician Assistant

## 2021-09-18 ENCOUNTER — Other Ambulatory Visit: Payer: Self-pay

## 2021-09-18 DIAGNOSIS — E669 Obesity, unspecified: Secondary | ICD-10-CM

## 2021-09-19 MED ORDER — INSULIN ASPART PROT & ASPART (70-30 MIX) 100 UNIT/ML ~~LOC~~ SUSP
20.0000 [IU] | Freq: Two times a day (BID) | SUBCUTANEOUS | 0 refills | Status: DC
  Filled 2021-09-19 – 2021-10-25 (×3): qty 10, 25d supply, fill #0

## 2021-09-20 ENCOUNTER — Other Ambulatory Visit: Payer: Self-pay

## 2021-09-27 ENCOUNTER — Other Ambulatory Visit: Payer: Self-pay

## 2021-10-02 ENCOUNTER — Other Ambulatory Visit: Payer: Self-pay

## 2021-10-09 ENCOUNTER — Ambulatory Visit: Payer: Self-pay | Admitting: Internal Medicine

## 2021-10-13 ENCOUNTER — Other Ambulatory Visit: Payer: Self-pay

## 2021-10-25 ENCOUNTER — Other Ambulatory Visit: Payer: Self-pay

## 2021-10-31 ENCOUNTER — Other Ambulatory Visit: Payer: Self-pay

## 2021-11-22 ENCOUNTER — Other Ambulatory Visit: Payer: Self-pay

## 2021-11-22 ENCOUNTER — Other Ambulatory Visit: Payer: Self-pay | Admitting: Internal Medicine

## 2021-11-22 DIAGNOSIS — E669 Obesity, unspecified: Secondary | ICD-10-CM

## 2021-11-22 MED ORDER — INSULIN ASPART PROT & ASPART (70-30 MIX) 100 UNIT/ML ~~LOC~~ SUSP
20.0000 [IU] | Freq: Two times a day (BID) | SUBCUTANEOUS | 0 refills | Status: DC
  Filled 2021-11-30: qty 10, 25d supply, fill #0

## 2021-11-30 ENCOUNTER — Other Ambulatory Visit: Payer: Self-pay

## 2021-11-30 ENCOUNTER — Other Ambulatory Visit: Payer: Self-pay | Admitting: Pharmacist

## 2021-11-30 DIAGNOSIS — E1169 Type 2 diabetes mellitus with other specified complication: Secondary | ICD-10-CM

## 2021-11-30 MED ORDER — HUMALOG MIX 75/25 (75-25) 100 UNIT/ML ~~LOC~~ SUSP
20.0000 [IU] | Freq: Two times a day (BID) | SUBCUTANEOUS | 0 refills | Status: DC
  Filled 2021-11-30: qty 10, 25d supply, fill #0

## 2021-11-30 MED ORDER — INSULIN LISPRO PROT & LISPRO (75-25 MIX) 100 UNIT/ML KWIKPEN
20.0000 [IU] | PEN_INJECTOR | Freq: Two times a day (BID) | SUBCUTANEOUS | 0 refills | Status: DC
  Filled 2021-11-30: qty 12, 30d supply, fill #0

## 2021-12-01 ENCOUNTER — Other Ambulatory Visit: Payer: Self-pay

## 2021-12-21 ENCOUNTER — Ambulatory Visit: Payer: Self-pay

## 2021-12-21 NOTE — Telephone Encounter (Signed)
  Chief Complaint: diabetes medication Symptoms: BS elevated in mornings but low in evenings Frequency: ongoing for couple of weeks since medication changed on 11/30/21 Pertinent Negatives:NA Disposition: [] ED /[] Urgent Care (no appt availability in office) / [] Appointment(In office/virtual)/ []  [place] [facility]/ [] Home Care/ [] Refused Recommended Disposition /[] [place] [facility]/ [x]  Follow-up with PCP Additional Notes: pt calling wanting to see if medication can be switched. He is having fluctuations in BS where as with Novolog BS were more stable. Pt states he noticed this difference when he got put on the Humalog [facility]. Advised pt I would send message back to provider and Lurena Joiner, [facility] to see if Humalog can be switched to vial instead of pens.   Summary: blood sugars elevated   Pt called saying his BS is going up and down.  He is taking 2 shots a day.  He has an appt oct 24 but there is nothing sooner.  He would like  a nurse to call him back   CB@  [phone]      Reason for Disposition  [1] Caller has URGENT medication or insulin pump question AND [2] triager unable to answer question  Answer Assessment - Initial Assessment Questions 1. BLOOD GLUCOSE: "What is your blood glucose level?"      210 this morning 3. USUAL RANGE: "What is your glucose level usually?" (e.g., usual fasting morning value, usual evening value)     Was staying stable with Novolog but since Humalog pt BS have been high in morning and low in evenings  5. TYPE 1 or 2:  "Do you know what type of diabetes you have?"  (e.g., Type 1, Type 2, Gestational; doesn't know)      DM 2 6. INSULIN: "Do you take insulin?" "What type of insulin(s) do you use? What is the mode of delivery? (syringe, pen; injection or pump)?"      Yes, was on Novolog but now on Humalog  Protocols used: Diabetes - High Blood Sugar-A-AH

## 2021-12-21 NOTE — Telephone Encounter (Signed)
Call placed to patient to schedule appointment no answer and no vm set up.

## 2021-12-21 NOTE — Telephone Encounter (Signed)
Routing to PCP for review.

## 2021-12-25 ENCOUNTER — Ambulatory Visit: Payer: Self-pay | Admitting: Internal Medicine

## 2022-01-02 ENCOUNTER — Ambulatory Visit: Payer: Self-pay

## 2022-01-02 NOTE — Telephone Encounter (Signed)
Answer Assessment - Initial Assessment Questions 1. BLOOD GLUCOSE: "What is your blood glucose level?"      Irregular reading- under 70, or high 210 2. ONSET: "When did you check the blood glucose?"     1 month- with pen 3. USUAL RANGE: "What is your glucose level usually?" (e.g., usual fasting morning value, usual evening value)      80 fasting 4. KETONES: "Do you check for ketones (urine or blood test strips)?" If Yes, ask: "What does the test show now?"      *No Answer* 5. TYPE 1 or 2:  "Do you know what type of diabetes you have?"  (e.g., Type 1, Type 2, Gestational; doesn't know)        6. INSULIN: "Do you take insulin?" "What type of insulin(s) do you use? What is the mode of delivery? (syringe, pen; injection or pump)?"      Patient states the pen makes him sweat, nausea-within 15-20 after 7. DIABETES PILLS: "Do you take any pills for your diabetes?" If Yes, ask: "Have you missed taking any pills recently?"      glipizide 8. OTHER SYMPTOMS: "Do you have any symptoms?" (e.g., fever, frequent urination, difficulty breathing, dizziness, weakness, vomiting)     sweating 9. PREGNANCY: "Is there any chance you are pregnant?" "When was your last menstrual period?"     *No Answer*  Protocols used: Diabetes - High Blood Sugar-A-AH

## 2022-01-02 NOTE — Telephone Encounter (Signed)
  Chief Complaint: irregular glucose readings on kwikpen- patient wants to go back to Novolog Symptoms: sweating, nausea, irregular readings Frequency: 1 month Pertinent Negatives: Patient denies   Disposition: [] ED /[] Urgent Care (no appt availability in office) / [] Appointment(In office/virtual)/ []  [place] [facility]/ [] Home Care/ [] Refused Recommended Disposition /[]  Mobile Bus/ []  Follow-up with PCP Additional Notes: Patient has upcoming appointment- he states he was changed to pen 1 month ago and it is not working for him. Patient states he gets sweaty and nauseated 15-30 minutes after injection and his glucose levels are fluctuating high to low. Patient states he was in much better control with the Novolog and would like to change back. Please let him know if he can do this    Reason for Disposition  [1] Caller has URGENT medication or insulin pump question AND [2] triager unable to answer question  Protocols used: Diabetes - High Blood Sugar-A-AH

## 2022-01-02 NOTE — Telephone Encounter (Signed)
Summary: blood sugar concerns   The patient would like to speak with a member of clinical staff when possible about their blood sugar   The patient shares that their sugar was 220 this morning around breakfast time 01/02/22   The patient has not experienced any dizziness or nausea today but has earlier in the week/weekend   Please contact the patient further when possible

## 2022-01-03 ENCOUNTER — Other Ambulatory Visit: Payer: Self-pay

## 2022-01-03 NOTE — Telephone Encounter (Signed)
Routing to CMA 

## 2022-01-05 ENCOUNTER — Other Ambulatory Visit: Payer: Self-pay | Admitting: Pharmacist

## 2022-01-05 ENCOUNTER — Other Ambulatory Visit: Payer: Self-pay

## 2022-01-05 DIAGNOSIS — E1169 Type 2 diabetes mellitus with other specified complication: Secondary | ICD-10-CM

## 2022-01-05 MED ORDER — INSULIN ASPART PROT & ASPART (70-30 MIX) 100 UNIT/ML ~~LOC~~ SUSP
20.0000 [IU] | Freq: Two times a day (BID) | SUBCUTANEOUS | 0 refills | Status: DC
  Filled 2022-01-05 – 2022-01-16 (×2): qty 10, 25d supply, fill #0

## 2022-01-12 ENCOUNTER — Other Ambulatory Visit: Payer: Self-pay

## 2022-01-16 ENCOUNTER — Encounter: Payer: Self-pay | Admitting: Internal Medicine

## 2022-01-16 ENCOUNTER — Ambulatory Visit: Payer: Self-pay | Attending: Internal Medicine | Admitting: Internal Medicine

## 2022-01-16 ENCOUNTER — Other Ambulatory Visit: Payer: Self-pay

## 2022-01-16 ENCOUNTER — Other Ambulatory Visit: Payer: Self-pay | Admitting: Physician Assistant

## 2022-01-16 VITALS — BP 124/74 | HR 68 | Ht 66.0 in | Wt 198.8 lb

## 2022-01-16 DIAGNOSIS — Z1211 Encounter for screening for malignant neoplasm of colon: Secondary | ICD-10-CM

## 2022-01-16 DIAGNOSIS — E1169 Type 2 diabetes mellitus with other specified complication: Secondary | ICD-10-CM

## 2022-01-16 DIAGNOSIS — E785 Hyperlipidemia, unspecified: Secondary | ICD-10-CM

## 2022-01-16 DIAGNOSIS — E669 Obesity, unspecified: Secondary | ICD-10-CM

## 2022-01-16 DIAGNOSIS — Z23 Encounter for immunization: Secondary | ICD-10-CM

## 2022-01-16 LAB — POCT GLYCOSYLATED HEMOGLOBIN (HGB A1C): HbA1c, POC (controlled diabetic range): 8.9 % — AB (ref 0.0–7.0)

## 2022-01-16 LAB — GLUCOSE, POCT (MANUAL RESULT ENTRY): POC Glucose: 221 mg/dl — AB (ref 70–99)

## 2022-01-16 MED ORDER — TRULICITY 0.75 MG/0.5ML ~~LOC~~ SOAJ
0.7500 mg | SUBCUTANEOUS | 4 refills | Status: DC
  Filled 2022-01-16: qty 2, 28d supply, fill #0
  Filled 2022-02-06 – 2022-02-14 (×3): qty 2, 28d supply, fill #1

## 2022-01-16 NOTE — Patient Instructions (Addendum)
Start Trulicity as discussed today. Stop the Glipizide 1 week from today. Continue to monitor blood sugars.  Bring readings with you in 1 mth when you come to see the clinical pharmacist.

## 2022-01-16 NOTE — Progress Notes (Signed)
Patient ID: Yovany Clock, male    DOB: [birth year]  MRN: [id number]  CC: chronic ds management   Subjective: Craig Ionescu is a 51 y.o. male who presents for chronic ds management His concerns today include:  Patient with history of diabetes type 2, obesity, CTS, abnormal LFTs   DIABETES TYPE 2 Last A1C:   Results for orders placed or performed in visit on 01/16/22  POCT glucose (manual entry)  Result Value Ref Range   POC Glucose 221 (A) 70 - 99 mg/dl  A1C 8.9  Med Adherence:  [x]  Yes - On Novolog 70/30 20 units BID and Glucotrol 5 mg BID Medication side effects:  [x]  Yes    []  No Home Monitoring?  [x]  Yes TID before meals   Home glucose results range: mid 200s Diet Adherence: [x]  Yes  - stopped drinking sodas, no fried foods, more baked foods Exercise: [x]  Yes once a wk.  Lacks energy.     Hypoglycemic episodes?: []  Yes    [x]  No - not since being back on Novolog 70/30; more so when he was on Humalog 75/25 Numbness of the feet? []  Yes    [x]  No Retinopathy hx? []  Yes    []  No Last eye exam: over due for eye exam. Problems with distance vision.  No insurance.  He has applied for insurance through the market place and expects to get card in the mail soon Comments: Taking atorvastatin daily as prescribed.  LDL in February of this year was not at goal but was not taking atorvastatin consistently at that time.  HM: Due for flu shot, COVID booster, shingles vaccine, colon cancer screening.  He is agreeable to receiving the flu shot and the fit test. Patient Active Problem List   Diagnosis Date Noted   Establishing care with new doctor, encounter for 04/11/2020   Type 2 diabetes mellitus with obesity ([place]) 04/11/2020   Abnormal LFTs 04/11/2020   Hyperosmolar hyperglycemic state (HHS) ([place]) 03/09/2020     Current Outpatient Medications on File Prior to Visit  Medication Sig Dispense Refill   atorvastatin (LIPITOR) 10 MG tablet TAKE 1 TABLET (10 MG TOTAL) BY MOUTH DAILY. 90 tablet  1   Blood Glucose Monitoring Suppl (TRUE METRIX METER) w/Device KIT Use three times daily 1 kit 0   glipiZIDE (GLUCOTROL) 5 MG tablet Take 1 tablet (5 mg total) by mouth 2 (two) times daily before a meal. 60 tablet 3   glucose blood (TRUE METRIX BLOOD GLUCOSE TEST) test strip Use as instructed 100 each 12   insulin aspart protamine- aspart (NOVOLOG MIX 70/30) (70-30) 100 UNIT/ML injection Inject 0.2 mLs (20 Units total) into the skin 2 (two) times daily with a meal. 10 mL 0   Insulin Syringe-Needle U-100 31G X 5/16" 0.5 ML MISC Use as directed 180 each 2   TRUEplus Lancets 28G MISC Testing 3 (three) times daily. 100 each 3   [DISCONTINUED] metFORMIN (GLUCOPHAGE) 500 MG tablet Take 1 tablet (500 mg total) by mouth daily with breakfast. 90 tablet 1   No current facility-administered medications on file prior to visit.    Allergies  Allergen Reactions   Metformin And Related Diarrhea   Other Other (See Comments)    Clorox. "flares me up."    Social History   Socioeconomic History   Marital status: Single    Spouse name: Not on file   Number of children: Not on file   Years of education: Not on file   Highest  education level: Not on file  Occupational History   Not on file  Tobacco Use   Smoking status: Never   Smokeless tobacco: Never  Vaping Use   Vaping Use: Never used  Substance and Sexual Activity   Alcohol use: No   Drug use: No   Sexual activity: Not on file  Other Topics Concern   Not on file  Social History Narrative   Not on file   Social Determinants of Health   Financial Resource Strain: Not on file  Food Insecurity: Not on file  Transportation Needs: Not on file  Physical Activity: Not on file  Stress: Not on file  Social Connections: Not on file  Intimate Partner Violence: Not on file    Family History  Problem Relation Age of Onset   Diabetes Other     No past surgical history on file.  ROS: Review of Systems Negative except as stated  above  PHYSICAL EXAM: BP 124/74   Pulse 68   Ht $R'5\' 6"'Ld$  (1.676 m)   Wt 198 lb 12.8 oz (90.2 kg)   SpO2 95%   BMI 32.09 kg/m   Physical Exam General appearance - alert, well appearing, middle-age African-American male and in no distress Mental status - normal mood, behavior, speech, dress, motor activity, and thought processes Neck - supple, no significant adenopathy Chest - clear to auscultation, no wheezes, rales or rhonchi, symmetric air entry Heart - normal rate, regular rhythm, normal S1, S2, no murmurs, rubs, clicks or gallops Extremities - peripheral pulses normal, no pedal edema, no clubbing or cyanosis Diabetic Foot Exam - Simple   Simple Foot Form Visual Inspection See comments: Yes Sensation Testing Intact to touch and monofilament testing bilaterally: Yes Pulse Check Posterior Tibialis and Dorsalis pulse intact bilaterally: Yes Comments Toenails are thick and discolored.        Latest Ref Rng & Units 05/17/2021    1:59 PM 06/16/2020   11:20 AM 03/11/2020    5:34 AM  CMP  Glucose 70 - 99 mg/dL 87   291   BUN 6 - 24 mg/dL 9   11   Creatinine 0.76 - 1.27 mg/dL 1.07   0.93   Sodium 134 - 144 mmol/L 139   134   Potassium 3.5 - 5.2 mmol/L 4.0   3.5   Chloride 96 - 106 mmol/L 100   99   CO2 20 - 29 mmol/L 25   24   Calcium 8.7 - 10.2 mg/dL 10.4   9.2   Total Protein 6.0 - 8.5 g/dL 8.5  8.0    Total Bilirubin 0.0 - 1.2 mg/dL 0.6  0.3    Alkaline Phos 44 - 121 IU/L 126  102    AST 0 - 40 IU/L 26  30    ALT 0 - 44 IU/L 25  31     Lipid Panel     Component Value Date/Time   CHOL 194 05/17/2021 1359   TRIG 128 05/17/2021 1359   HDL 40 05/17/2021 1359   CHOLHDL 4.9 05/17/2021 1359   LDLCALC 131 (H) 05/17/2021 1359    CBC    Component Value Date/Time   WBC 5.0 05/17/2021 1359   WBC 6.9 03/10/2020 0252   RBC 5.19 05/17/2021 1359   RBC 4.97 03/10/2020 0252   HGB 15.2 05/17/2021 1359   HCT 44.4 05/17/2021 1359   PLT 190 05/17/2021 1359   MCV 86 05/17/2021  1359   MCH 29.3 05/17/2021 1359   MCH 29.8  03/10/2020 0252   MCHC 34.2 05/17/2021 1359   MCHC 34.7 03/10/2020 0252   RDW 13.0 05/17/2021 1359   LYMPHSABS 2.7 05/17/2021 1359   MONOABS 0.4 03/09/2020 0800   EOSABS 0.1 05/17/2021 1359   BASOSABS 0.0 05/17/2021 1359    ASSESSMENT AND PLAN: 1. Type 2 diabetes mellitus with obesity ([place]) Not at goal. Dietary counseling given.  Encouraged him to move more. Continue NovoLog 70/30 20 units twice a day.  We discussed adding Trulicity once weekly injection.  Informed the patient of how the medication works.  Went over possible side effects including vomiting, pancreatitis and bowel blockage.  Advised that if he develops any vomiting, or pain in the upper abdomen or unable to pass his bowel.  He should stop the medicine and give me a call.  Medicine can sometimes cause nausea especially within the first several weeks.  Patient was agreeable to trying the medication.  Advised that once he has been on the medicine for 1 week, he should stop the glipizide but continue the insulin.  He should continue to monitor blood sugars.  I will have him follow-up with the clinical pharmacist in 1 month for check. He will let me know once he has insurance so that we can put him in for the eye exam. - POCT glucose (manual entry) - POCT glycosylated hemoglobin (Hb A1C) - Dulaglutide (TRULICITY) 6.70 LI/1.0VU SOPN; Inject 0.75 mg into the skin once a week.  Dispense: 2 mL; Refill: 4 - Microalbumin / creatinine urine ratio  2. Hyperlipidemia associated with type 2 diabetes mellitus (HCC) Continue atorvastatin.  Recheck lipid profile to see if his LDL is now at goal - Lipid panel  3. Need for influenza vaccination Given today. Advised to get COVID booster from the health department or any outside pharmacy.  4. Screening for colon cancer - Fecal occult blood, imunochemical([facility])     Patient was given the opportunity to ask questions.  Patient  verbalized understanding of the plan and was able to repeat key elements of the plan.   This documentation was completed using Radio producer.  Any transcriptional errors are unintentional.  Orders Placed This Encounter  Procedures   Fecal occult blood, imunochemical([facility])   Lipid panel   Microalbumin / creatinine urine ratio   POCT glucose (manual entry)   POCT glycosylated hemoglobin (Hb A1C)     Requested Prescriptions   Signed Prescriptions Disp Refills   Dulaglutide (TRULICITY) 1.31 YH/8.8IL SOPN 2 mL 4    Sig: Inject 0.75 mg into the skin once a week.    Return in about 3 months (around 04/18/2022) for Appt with [facility] in 4 wks for BS check.  Karle Plumber, MD, FACP

## 2022-01-17 ENCOUNTER — Other Ambulatory Visit: Payer: Self-pay

## 2022-01-17 MED ORDER — GLIPIZIDE 5 MG PO TABS
5.0000 mg | ORAL_TABLET | Freq: Two times a day (BID) | ORAL | 0 refills | Status: DC
  Filled 2022-01-17 – 2022-02-14 (×2): qty 14, 7d supply, fill #0

## 2022-01-19 LAB — MICROALBUMIN / CREATININE URINE RATIO
Creatinine, Urine: 341.1 mg/dL
Microalb/Creat Ratio: 4 mg/g creat (ref 0–29)
Microalbumin, Urine: 14.5 ug/mL

## 2022-01-23 ENCOUNTER — Other Ambulatory Visit: Payer: Self-pay

## 2022-02-06 ENCOUNTER — Other Ambulatory Visit: Payer: Self-pay

## 2022-02-06 ENCOUNTER — Other Ambulatory Visit: Payer: Self-pay | Admitting: Internal Medicine

## 2022-02-06 ENCOUNTER — Other Ambulatory Visit: Payer: Self-pay | Admitting: Physician Assistant

## 2022-02-06 DIAGNOSIS — E1169 Type 2 diabetes mellitus with other specified complication: Secondary | ICD-10-CM

## 2022-02-06 MED ORDER — INSULIN ASPART PROT & ASPART (70-30 MIX) 100 UNIT/ML ~~LOC~~ SUSP
20.0000 [IU] | Freq: Two times a day (BID) | SUBCUTANEOUS | 3 refills | Status: DC
  Filled 2022-02-06 – 2022-02-14 (×2): qty 10, 25d supply, fill #0

## 2022-02-06 MED ORDER — ATORVASTATIN CALCIUM 10 MG PO TABS
10.0000 mg | ORAL_TABLET | Freq: Every day | ORAL | 3 refills | Status: AC
  Filled 2022-02-06 – 2022-02-22 (×3): qty 30, 30d supply, fill #0
  Filled 2022-03-28: qty 30, 30d supply, fill #1

## 2022-02-07 ENCOUNTER — Other Ambulatory Visit: Payer: Self-pay

## 2022-02-14 ENCOUNTER — Other Ambulatory Visit: Payer: Self-pay

## 2022-02-16 ENCOUNTER — Other Ambulatory Visit: Payer: Self-pay

## 2022-02-16 ENCOUNTER — Telehealth: Payer: Self-pay

## 2022-02-16 NOTE — Telephone Encounter (Signed)
PRIOR AUTHORIZATION SUBMITTED TODAY FOR NOVOLOG 70/30 VIA COVERMYMEDS KEY: [id number]  APPROVED UNTIL 02/16/23

## 2022-02-21 NOTE — Progress Notes (Unsigned)
    S:     PCP: Dr. Laural Benes  51 y.o. male who presents for diabetes evaluation, education, and management. PMH is significant for T2DM and HLD.   Patient was referred and last seen by Primary Care Provider, Dr. Laural Benes, on 01/16/2022. At last visit, Trulicity was added to his medication regimen. He was instructed to stop the glipizide after taking one week of Trulicity.  Today, patient arrives in good spirits and presents without any assistance.  Family/Social History:  -Fhx: DM -Tobacco: never -Alcohol:no  Current diabetes medications include: Trulicity 0.75mg  once weekly, Novolog 70/30 20 units BID Current hyperlipidemia medications include: atorvastatin 10mg  once daily  Patient reports adherence to taking all medications as prescribed.   Insurance coverage: self-pay  Patient denies hypoglycemic events.  Reported home fasting blood sugars: 90s  Reported 2 hour post-meal/random blood sugars: 170s. No reported readings greater than 180.  Patient denies nocturia (nighttime urination).  Patient reports neuropathy (nerve pain). Patient reports visual changes.  Patient reported dietary habits:  -Eats mostly baked fish & chicken  -Sweets every other night or less frequently  -drinks zero sugar sprite  Patient-reported exercise habits:  -walk a mile every other day    O:  Lab Results  Component Value Date   HGBA1C 8.9 (A) 01/16/2022   There were no vitals filed for this visit.  Lipid Panel     Component Value Date/Time   CHOL 194 05/17/2021 1359   TRIG 128 05/17/2021 1359   HDL 40 05/17/2021 1359   CHOLHDL 4.9 05/17/2021 1359   LDLCALC 131 (H) 05/17/2021 1359    Clinical Atherosclerotic Cardiovascular Disease (ASCVD): No  The 10-year ASCVD risk score (Arnett DK, et al., 2019) is: 11.3%   Values used to calculate the score:     Age: 59 years     Sex: Male     Is Non-Hispanic African American: Yes     Diabetic: Yes     Tobacco smoker: No     Systolic Blood  Pressure: 124 mmHg     Is BP treated: No     HDL Cholesterol: 40 mg/dL     Total Cholesterol: 194 mg/dL   A/P: Diabetes longstanding currently controlled based on home BG readings. Patient is able to verbalize appropriate hypoglycemia management plan. Medication adherence appears appropriate.  -Changed from Novolog 70/30 to Basaglar 28 units once daily. Patient will down-titrate 2-3 units every 2-3 days if blood sugars are consistently < 70mg /dL.  -Discontinued Novolog 70/30 20 units BID  -Increased dose of GLP-1 Trulicity to 1.5mg  once weekly  -Patient educated on purpose, proper use, and potential adverse effects of insulin.  -Extensively discussed pathophysiology of diabetes, recommended lifestyle interventions, dietary effects on blood sugar control.  -Counseled on s/sx of and management of hypoglycemia.  -Next A1c anticipated January.   ASCVD risk - primary prevention in patient with diabetes. Last LDL is 131 not at goal of mg/dL. ASCVD risk factors include DM and 10-year ASCVD risk score of 11.3%.  -Continued atorvastatin 10 mg.    Written patient instructions provided. Patient verbalized understanding of treatment plan.  Total time in face to face counseling 20 minutes.    Follow-up:  PCP clinic visit in 04/20/2021  <23, PharmD PGY-1 [facility] Resident

## 2022-02-22 ENCOUNTER — Ambulatory Visit: Payer: Self-pay | Attending: Internal Medicine | Admitting: Pharmacist

## 2022-02-22 ENCOUNTER — Other Ambulatory Visit: Payer: Self-pay

## 2022-02-22 DIAGNOSIS — E1169 Type 2 diabetes mellitus with other specified complication: Secondary | ICD-10-CM

## 2022-02-22 DIAGNOSIS — E669 Obesity, unspecified: Secondary | ICD-10-CM

## 2022-02-22 MED ORDER — TRULICITY 1.5 MG/0.5ML ~~LOC~~ SOAJ
1.5000 mg | SUBCUTANEOUS | 3 refills | Status: AC
  Filled 2022-02-22 (×2): qty 2, 28d supply, fill #0
  Filled 2022-03-20 – 2022-03-28 (×2): qty 2, 28d supply, fill #1

## 2022-02-22 MED ORDER — BASAGLAR KWIKPEN 100 UNIT/ML ~~LOC~~ SOPN
28.0000 [IU] | PEN_INJECTOR | Freq: Every day | SUBCUTANEOUS | 2 refills | Status: AC
  Filled 2022-02-22: qty 9, 30d supply, fill #0
  Filled 2022-03-28: qty 9, 30d supply, fill #1

## 2022-02-28 ENCOUNTER — Other Ambulatory Visit: Payer: Self-pay

## 2022-03-06 ENCOUNTER — Other Ambulatory Visit: Payer: Self-pay

## 2022-03-12 ENCOUNTER — Other Ambulatory Visit: Payer: Self-pay | Admitting: Physician Assistant

## 2022-03-12 ENCOUNTER — Other Ambulatory Visit: Payer: Self-pay

## 2022-03-12 DIAGNOSIS — E1169 Type 2 diabetes mellitus with other specified complication: Secondary | ICD-10-CM

## 2022-03-12 MED ORDER — TRUEPLUS LANCETS 28G MISC
1.0000 | Freq: Three times a day (TID) | 3 refills | Status: AC
  Filled 2022-03-12: qty 100, 33d supply, fill #0

## 2022-03-20 ENCOUNTER — Other Ambulatory Visit: Payer: Self-pay

## 2022-03-28 ENCOUNTER — Other Ambulatory Visit: Payer: Self-pay

## 2022-03-29 ENCOUNTER — Other Ambulatory Visit: Payer: Self-pay

## 2022-04-02 ENCOUNTER — Other Ambulatory Visit: Payer: Self-pay

## 2022-04-10 ENCOUNTER — Other Ambulatory Visit: Payer: Self-pay

## 2022-04-20 ENCOUNTER — Ambulatory Visit: Payer: Self-pay | Admitting: Internal Medicine

## 2022-04-30 ENCOUNTER — Other Ambulatory Visit: Payer: Self-pay
